# Patient Record
Sex: Male | Born: 1975 | Race: White | Hispanic: No | Marital: Single | Smoking: Current every day smoker
Health system: Southern US, Community
[De-identification: ages and names within clinical notes are randomized; demographics above are authoritative.]

## PROBLEM LIST (undated history)

## (undated) DIAGNOSIS — M419 Scoliosis, unspecified: Secondary | ICD-10-CM

## (undated) HISTORY — PX: MANDIBLE SURGERY: SHX707

## (undated) HISTORY — PX: TONSILLECTOMY: SUR1361

## (undated) HISTORY — PX: FRACTURE SURGERY: SHX138

## (undated) HISTORY — PX: CARPAL TUNNEL RELEASE: SHX101

## (undated) HISTORY — PX: ORTHOPEDIC SURGERY: SHX850

---

## 2001-04-07 ENCOUNTER — Ambulatory Visit (HOSPITAL_COMMUNITY): Admission: RE | Admit: 2001-04-07 | Discharge: 2001-04-07 | Payer: Self-pay | Admitting: *Deleted

## 2006-01-28 ENCOUNTER — Emergency Department (HOSPITAL_COMMUNITY): Admission: EM | Admit: 2006-01-28 | Discharge: 2006-01-28 | Payer: Self-pay | Admitting: Emergency Medicine

## 2007-01-18 ENCOUNTER — Ambulatory Visit (HOSPITAL_COMMUNITY): Admission: RE | Admit: 2007-01-18 | Discharge: 2007-01-18 | Payer: Self-pay | Admitting: Orthopaedic Surgery

## 2007-03-23 ENCOUNTER — Ambulatory Visit (HOSPITAL_COMMUNITY): Admission: RE | Admit: 2007-03-23 | Discharge: 2007-03-23 | Payer: Self-pay | Admitting: Orthopaedic Surgery

## 2010-06-01 ENCOUNTER — Emergency Department (HOSPITAL_COMMUNITY): Admission: EM | Admit: 2010-06-01 | Discharge: 2010-06-01 | Payer: Self-pay | Admitting: Emergency Medicine

## 2010-12-09 NOTE — H&P (Signed)
Adam Wyatt, Adam Wyatt              ACCOUNT NO.:  [id number]   MEDICAL RECORD NO.:  [id number]          PATIENT TYPE:  AMB   LOCATION:  DAY                           FACILITY:  [facility]   PHYSICIAN:  J. Darreld Mclean, M.D. DATE OF BIRTH:  [birth year]   DATE OF ADMISSION:  03/22/2007  DATE OF DISCHARGE:  LH                              HISTORY & PHYSICAL   CHIEF COMPLAINT:  My arm hurts.  Got carpal tunnel.   HISTORY OF PRESENT ILLNESS:  The patient is a 35 year old male with pain  and tenderness in his right hand for some time.  He has numbness,  dropping things.  He had EMG studies done by Dr. Meryl Crutch on March 15, 2007.  It shows abnormal study of the right median mononeuropathy  consistent with moderate right carpal tunnel syndrome without  denervation.  The patient did not improve with conservative treatment,  did not improve with splinting.  The patient does have underlying gout.  I have recommended surgical release of the volar carpal ligament.  He  understands the procedure risks and imponderables   PAST HISTORY:  1. Denies heart disease, lung disease, kidney disease per paralysis,      weakness, hypertension, diabetes, TB, rheumatic fever, cancer,      ulcer disease or heart problems.  2. He does have gout.   ALLERGIES:  He has no allergies.   HABITS:  Does not smoke.  Does not use alcoholic beverages.   MEDICATIONS:  The patient takes allopurinol 300 mg once or twice a day  and colchicine 0.6 mg t.i.d. as needed.  He does not have a family  doctor.   PAST SURGERIES:  1. He has a broken jaw 1999.  2. He had surgery by me on his knee years ago to the right patella on      October 13, 1991.   SOCIAL HISTORY:  The patient is not married, lives in [place].   PHYSICAL EXAMINATION:  GENERAL:  The patient is alert, cooperative,  oriented.  HEENT: Negative.  NECK: Supple.  LUNGS: Clear to P&A.  HEART: Regular without murmur heard.  ABDOMEN:  Soft without masses.  EXTREMITIES:  Positive Phalen's, positive Tinel's on the right.  Decreased sensation of the median nerve.  Negative on the left.  Other  extremities negative.  CNS: Intact.  SKIN:  Intact.   IMPRESSION:  Carpal tunnel syndrome on the right.   PLAN:  Open release of volar carpal ligament.  Labs are pending.                                            ______________________________  J. Darreld Mclean, M.D.     JWK/MEDQ  D:  03/22/2007  T:  03/23/2007  Job:  [id number]

## 2010-12-09 NOTE — Op Note (Signed)
NAMESHAQUAN, Adam Wyatt              ACCOUNT NO.:  [id number]   MEDICAL RECORD NO.:  [id number]          PATIENT TYPE:  AMB   LOCATION:  DAY                           FACILITY:  [facility]   PHYSICIAN:  J. Darreld Mclean, M.D. DATE OF BIRTH:  1976/07/26   DATE OF PROCEDURE:  DATE OF DISCHARGE:                               OPERATIVE REPORT   PREOPERATIVE DIAGNOSES:  Carpal tunnel syndrome, right.   POSTOPERATIVE DIAGNOSES:  Carpal tunnel syndrome, right.   PROCEDURE:  Open release volar carpal ligaments, saline neurolysis right  median nerve.   ANESTHESIA:  Nerve block.   SURGEON:  J. Darreld Mclean, M.D.   Volar plaster splint applied during the procedure. No drains.   INDICATIONS FOR PROCEDURE:  The patient is a 35 year old male with pain  and tenderness and right hand numbness, weakness. He has positive EMGs  showing moderate carpal tunnel on the right. The patient also has  underlying gout. Conservative treatment has been unsuccessful. The risks  and imponderables of the procedure were discussed preoperatively. He  appeared to understand and agreed to the procedure.   DESCRIPTION OF PROCEDURE:  The patient was seen in the holding area, the  right wrist was identified as the correct surgical site. I placed a mark  on the wrist and so did he. He was brought back to the operating room,  placed supine on the operating table with a tourniquet placed, deflated  right upper arm. He had a Bier block anesthesia. He was then prepped and  draped in the usual manner. We had a time out identifying [name] as  the patient and the right hand as the correct surgical site. An incision  was made on the volar aspect of the hand, the proximal retinaculum was  identified, the median nerve was identified, a Vessiloop placed around  the median nerve. A groove director placed in the carpal tunnel space.  The volar carpal ligament was then incised. With careful dissection the  median nerve was exposed.  It was compressed. The retinaculum was cut  proximally. A specimen of volar carpal ligament was sent to pathology.  Saline neurolysis and upper neurotomy carried out. The wound then  reinspected and looked good. The wound was then reapproximated using 3-0  nylon interrupted in a vertical mattress manner. A sterile dressing  applied, bulky dressing applied and sheet cotton applied. Sheet cotton  cut dorsally, volar plaster  splint applied. The patient tolerated the procedure well, the bier block  was deflated in the usual fashion. Went to recovery, appropriate  analgesia given for pain. I will see him in the office in approximately  10 days to 2 weeks. If any difficulty call me through office or hospital  beeper system. Numbers have been provided.           ______________________________  Shela Commons. Darreld Mclean, M.D.     JWK/MEDQ  D:  03/23/2007  T:  03/24/2007  Job:  [id number]

## 2010-12-12 NOTE — Op Note (Signed)
[place]  Patient:    Adam Wyatt, Adam Wyatt Visit Number: [id number] MRN: [id number]          Service Type: DSU Location: [facility] 28[address] 35 Attending Physician:  Effie Berkshire Dictated by:   Shela Commons. Kristen Cardinal, D.D.S. Proc. Date: 04/07/01 Admit Date:  04/07/2001                             Operative Report  PREOPERATIVE DIAGNOSIS:  Fractured right angle of the mandible.  POSTOPERATIVE DIAGNOSIS:  Fractured right angle of the mandible.  OPERATION PERFORMED:  Open and closed reduction of fractured right angle of the mandible with application of maxillary and mandibular arch bars and application of a titanium bone plate.  SURGEON:  Saddie Benders, D.D.S.  ANESTHESIA:  General.  COMPLICATIONS:  None.  DESCRIPTION OF PROCEDURE:  On April 07, 2001 this patient presented himself to the operating room of [facility] where after obtaining the proper level of anesthesia with the use of nasotracheal intubation, a nasogastric tube was placed.  A two-inch wet vaginal gauze packing was then placed in the oropharynx and an intraoral Betadine prep  was completed.  Sterile drapes were used to isolate the surgical field and the oral cavity thoroughly irrigated and suctioned.  At this time a maxillary and mandibular arch bar were placed and each was secured with #26 and 24 gauge stainless steel wires.  Oral cavity was thoroughly irrigated and suctioned and the throat pack was removed and at this time the decision was made to leave tooth #32 in position.  This was because the tooth did not demonstrate any increase in mobility and even though the fracture site was distal to the most distal root, the soft tissue attachment around the tooth was intact.  There was no exudate and there appeared to be no definite communication with the oral cavity.  Since this tooth was left, we then went ahead and placed the patient into  intermaxillary fixation utilizing four #26 gauge stainless steel wire loops.  As previously noted, this patient has an extreme malocclusion and the fact that he has a class 3 occlusion with an anterior open bite.  On the left side, preoperatively, it was determined that he had probably two molars and one bicuspid in contact and on the right side one and possibly two molars in contact.  His anterior teeth did not appear to be functional.  At this point the surgeons and assistants all degowned and degloved and the surgeon rescrubbed and everybody else placed new gowns and gloves on.  A Betadine prep was completed to the right side of the face and sterile drapes and towels were used to isolate the surgical field.  A total of 7.2 cc of 0.5% Marcaine with 1:200,000 epinephrine were utilized for local anesthesia and a marking pen was used to mark the inferior border of the mandible, the angle of the mandible and the fracture site.  A #15 blade was used to make a straight line incision approximately two fingers width below the inferior border of the right body and angle of the mandible.  Sharp dissection was carried through the skin and subcutaneous tissues and then the Metzenbaum scissors were used to undermine the tissue.  At this stage, blunt dissection was carried out and a nerve stimulator was used before any tissue was incised to observe for the marginal mandibular Carmer of the facial nerve.  It was encountered in the very anterior aspect of the incision and by simply going inferior to this area of tissue the nerve was avoided.  Blunt and sharp dissection was carried out to the platysma and then sharp dissection through the masseter muscle down to the periosteum overlying the fracture site which was easily identified.  The periosteal elevator was used to reflect the periosteum on the labial and lingual aspects of the mandible and the angle of the mandible and anterior towards the body.   Once the fracture was positively identified, Dingman forceps were used to reduce the fracture and hold it in position and at this point an alveoloplasty bur was used to smooth  a bony irregularity overlying the angle of the mandible.  A 4-0 holed titanium bone plate 2.5 mm was then bent into position over the fracture site and it was then secured after drilling 2.0 mm bur hole through both cortical plates and then using a 2.4 screw.  Satisfactory reduction was obtained and it was found to be very stable.  The entire incision was thoroughly irrigated and suctioned and then closure of the masseter muscle and periosteum was accomplished with #3-0 gut suture.  The platysma and subcutaneous tissue was reapproximated with #4-0 gut suture and then the skin incision was reapproximated with a #5-0 subcuticular nylon and a #5-0 running skin suture.  Bacitracin ointment was placed over the incision site, Telfa and then a Kerlix dressing.  The oral cavity was then thoroughly inspected, suctioned and the occlusion found to be that as previously obtained.  The patient was then transferred to the recovery room after removal of the nasogastric tube and the endotracheal tube in satisfactory condition.  Time for the procedure two and one half hours. Estimated blood loss for this procedure was 100 cc.   Condition of patient when last examined was deemed satisfactory. Dictated by:   Shela Commons. Kristen Cardinal, D.D.S. Attending Physician:  Effie Berkshire DD:  04/07/01 TD:  04/07/01 Job: [id number]

## 2011-01-05 ENCOUNTER — Other Ambulatory Visit (HOSPITAL_COMMUNITY): Payer: Self-pay | Admitting: Orthopaedic Surgery

## 2011-01-05 DIAGNOSIS — M25562 Pain in left knee: Secondary | ICD-10-CM

## 2011-01-07 ENCOUNTER — Ambulatory Visit (HOSPITAL_COMMUNITY)
Admission: RE | Admit: 2011-01-07 | Discharge: 2011-01-07 | Disposition: A | Discharge status: Home / Self Care | Payer: Medicaid Other | Source: Ambulatory Visit | Attending: Orthopaedic Surgery | Admitting: Orthopaedic Surgery

## 2011-01-07 DIAGNOSIS — M25569 Pain in unspecified knee: Secondary | ICD-10-CM | POA: Insufficient documentation

## 2011-01-07 DIAGNOSIS — M25562 Pain in left knee: Secondary | ICD-10-CM

## 2011-02-03 ENCOUNTER — Encounter: Payer: Self-pay | Admitting: *Deleted

## 2011-02-03 ENCOUNTER — Emergency Department (HOSPITAL_COMMUNITY)
Admission: EM | Admit: 2011-02-03 | Discharge: 2011-02-03 | Disposition: A | Discharge status: Home / Self Care | Payer: Medicaid Other | Attending: Emergency Medicine | Admitting: Emergency Medicine

## 2011-02-03 DIAGNOSIS — F17200 Nicotine dependence, unspecified, uncomplicated: Secondary | ICD-10-CM | POA: Insufficient documentation

## 2011-02-03 DIAGNOSIS — M4120 Other idiopathic scoliosis, site unspecified: Secondary | ICD-10-CM | POA: Insufficient documentation

## 2011-02-03 DIAGNOSIS — W540XXA Bitten by dog, initial encounter: Secondary | ICD-10-CM | POA: Insufficient documentation

## 2011-02-03 DIAGNOSIS — S81009A Unspecified open wound, unspecified knee, initial encounter: Secondary | ICD-10-CM | POA: Insufficient documentation

## 2011-02-03 DIAGNOSIS — Y92009 Unspecified place in unspecified non-institutional (private) residence as the place of occurrence of the external cause: Secondary | ICD-10-CM | POA: Insufficient documentation

## 2011-02-03 DIAGNOSIS — IMO0002 Reserved for concepts with insufficient information to code with codable children: Secondary | ICD-10-CM | POA: Insufficient documentation

## 2011-02-03 HISTORY — DX: Scoliosis, unspecified: M41.9

## 2011-02-03 MED ORDER — AMOXICILLIN-POT CLAVULANATE 875-125 MG PO TABS
1.0000 | ORAL_TABLET | Freq: Once | ORAL | Status: AC
  Administered 2011-02-03: 1 via ORAL
  Filled 2011-02-03: qty 1

## 2011-02-03 MED ORDER — AMOXICILLIN-POT CLAVULANATE 875-125 MG PO TABS: 1.0000 | ORAL_TABLET | Freq: Two times a day (BID) | ORAL | Status: AC

## 2011-02-03 MED ORDER — NAPROXEN 500 MG PO TABS: 500.0000 mg | ORAL_TABLET | Freq: Two times a day (BID) | ORAL | Status: AC

## 2011-02-03 MED ORDER — BACITRACIN ZINC 500 UNIT/GM EX OINT
TOPICAL_OINTMENT | CUTANEOUS | Status: AC
  Filled 2011-02-03: qty 0.9

## 2011-02-03 MED ORDER — BACITRACIN-NEOMYCIN-POLYMYXIN 400-5-5000 EX OINT
TOPICAL_OINTMENT | Freq: Once | CUTANEOUS | Status: AC
  Administered 2011-02-03: 22:00:00 via TOPICAL

## 2011-02-03 MED ORDER — TETANUS-DIPHTH-ACELL PERTUSSIS 5-2-15.5 LF-MCG/0.5 IM SUSP
0.5000 mL | Freq: Once | INTRAMUSCULAR | Status: AC
  Administered 2011-02-03: 0.5 mL via INTRAMUSCULAR
  Filled 2011-02-03: qty 0.5

## 2011-02-03 NOTE — ED Provider Notes (Signed)
History     Chief Complaint  Patient presents with  . Animal Bite   HPI Comments: patient c/o pain to right lower leg after being bitten by a dog.  States he was chasing his dog and his dog ran into another person's yard and the other dog bit him on the right anterior leg.  Patient does not know the status of the dog's vaccinations.  Incident occurred in [place].   Pt denies numbness, weakness, bleeding or other injuries   Patient is a 35 y.o. male presenting with animal bite. The history is provided by the patient.  Animal Bite  The incident occurred today. The incident occurred at another residence. He came to the ER via personal transport. There is an injury to the right lower leg. The pain is mild. It is unlikely that a foreign body is present. Associated symptoms include pain when bearing weight. Pertinent negatives include no numbness, no inability to bear weight, no neck pain, no focal weakness, no tingling and no weakness. There have been no prior injuries to these areas. His tetanus status is out of date. He has been behaving normally. There were no sick contacts. He has received no recent medical care.    Past Medical History  Diagnosis Date  . Scoliosis     Past Surgical History  Procedure Date  . Carpal tunnel release   . Orthopedic surgery     pt has two screws in right knee, place in left arm     History reviewed. No pertinent family history.  History  Substance Use Topics  . Smoking status: Current Some Day Smoker  . Smokeless tobacco: Not on file  . Alcohol Use: 1.2 oz/week    2 Cans of beer per week      Review of Systems  Constitutional: Negative.   HENT: Negative.  Negative for neck pain.   Respiratory: Negative.   Cardiovascular: Negative.   Skin: Positive for wound. Negative for color change.  Neurological: Negative for tingling, focal weakness, weakness and numbness.  Hematological: Does not bruise/bleed easily.    Physical Exam  BP  129/69  Pulse 83  Temp(Src) 98.2 F (36.8 C) (Oral)  Resp 20  Ht 5\' 11"  (1.803 m)  Wt 215 lb (97.523 kg)  BMI 29.99 kg/m2  SpO2 99%  Physical Exam  Constitutional: He is oriented to person, place, and time. He appears well-developed and well-nourished.  HENT:  Head: Normocephalic and atraumatic.  Eyes: EOM are normal. Pupils are equal, round, and reactive to light.  Neck: Normal range of motion. Neck supple.  Cardiovascular: Normal rate, regular rhythm and normal heart sounds.   Pulmonary/Chest: Effort normal and breath sounds normal.  Musculoskeletal: He exhibits no edema and no tenderness.  Neurological: He is alert and oriented to person, place, and time.  Skin:       Localized abrasion and several small puncture wounds to the anterior right lower leg.  No edema or bleeding.  Pt has full ROM of the extremity.    Psychiatric: He has a normal mood and affect.    ED Course  Procedures  MDM   Sheriff came to ED to file report.  Will try to capture the dog.  Sheriff's called ED to inform us that the dog has been contained and will be observed.  Patient has been advised to return for any signs of infection and will begin abx therapy, update Td and wound was cleaned htroughly by the nursing staff  Emmaus Brandi L. [place], [place] 02/07/11 1545

## 2011-02-03 NOTE — ED Notes (Signed)
Pt states that he was trying to get his dog who had run away from home away from another yard, pt went to get his dog  and was bitten by another dog,  Pt has abrasion to left ring finger and lacerations to right lower leg, bleeding controlled,

## 2011-02-03 NOTE — ED Notes (Signed)
[facility] here to take pt report

## 2011-02-03 NOTE — ED Notes (Signed)
[facility] called back to er and advised that the dog's tetanus status had expired, advised that the dog would be quarantined, tammy pa notified

## 2011-02-10 NOTE — ED Provider Notes (Signed)
Medical screening examination/treatment/procedure(s) were performed by non-physician practitioner and as supervising physician I was immediately available for consultation/collaboration.  Joya Gaskins, MD 02/10/11 [phone]

## 2011-05-08 LAB — DIFFERENTIAL
Basophils Absolute: 0
Basophils Relative: 0
Lymphocytes Relative: 26
Neutro Abs: 5.2
Neutrophils Relative %: 65

## 2011-05-08 LAB — COMPREHENSIVE METABOLIC PANEL
Alkaline Phosphatase: 56
BUN: 9
CO2: 30
Chloride: 102
Creatinine, Ser: 0.95
GFR calc Af Amer: >60
GFR calc non Af Amer: >60
Glucose, Bld: 91
Potassium: 4.3
Sodium: 138
Total Bilirubin: 0.8

## 2011-05-08 LAB — CBC
MCHC: 33.2
RDW: 14.8 — ABNORMAL HIGH

## 2011-05-08 LAB — URINALYSIS, ROUTINE W REFLEX MICROSCOPIC
Bilirubin Urine: NEGATIVE
Nitrite: NEGATIVE
Specific Gravity, Urine: 1.02
Urobilinogen, UA: 0.2
pH: 6.5

## 2012-02-04 ENCOUNTER — Other Ambulatory Visit (HOSPITAL_COMMUNITY): Payer: Self-pay | Admitting: Orthopaedic Surgery

## 2012-02-04 DIAGNOSIS — M542 Cervicalgia: Secondary | ICD-10-CM

## 2012-02-09 ENCOUNTER — Ambulatory Visit (HOSPITAL_COMMUNITY)
Admission: RE | Admit: 2012-02-09 | Discharge: 2012-02-09 | Disposition: A | Discharge status: Home / Self Care | Payer: Medicaid Other | Source: Ambulatory Visit | Attending: Orthopaedic Surgery | Admitting: Orthopaedic Surgery

## 2012-02-09 DIAGNOSIS — M5030 Other cervical disc degeneration, unspecified cervical region: Secondary | ICD-10-CM | POA: Insufficient documentation

## 2012-02-09 DIAGNOSIS — M542 Cervicalgia: Secondary | ICD-10-CM | POA: Insufficient documentation

## 2012-03-21 ENCOUNTER — Ambulatory Visit (HOSPITAL_COMMUNITY)
Admission: RE | Admit: 2012-03-21 | Discharge: 2012-03-21 | Disposition: A | Discharge status: Home / Self Care | Payer: Medicaid Other | Source: Ambulatory Visit | Attending: Orthopaedic Surgery | Admitting: Orthopaedic Surgery

## 2012-03-21 DIAGNOSIS — M542 Cervicalgia: Secondary | ICD-10-CM | POA: Insufficient documentation

## 2012-03-21 DIAGNOSIS — IMO0001 Reserved for inherently not codable concepts without codable children: Secondary | ICD-10-CM | POA: Insufficient documentation

## 2012-03-21 DIAGNOSIS — M6281 Muscle weakness (generalized): Secondary | ICD-10-CM | POA: Insufficient documentation

## 2012-03-21 NOTE — Evaluation (Signed)
Physical Therapy Evaluation  Patient Details  Name: Adam Wyatt MRN: [id number] Date of Birth: 05-16-1976  Today's Date: 03/21/2012 Time: 6213-0865 PT Time Calculation (min): 42 min  Visit#: 1  of 4   Re-eval: 04/01/12    Authorization: medicaid  Authorization Time Period: not authorized yet  Authorization Visit#: 0  of 3    Past Medical History:  Past Medical History  Diagnosis Date  . Scoliosis    Past Surgical History:  Past Surgical History  Procedure Date  . Carpal tunnel release   . Orthopedic surgery     pt has two screws in right knee, place in left arm     SubjectiveINITIAL EVALUATION  Physical Therapy   Patient Name: Adam Wyatt Date Of Birth: 09-Aug-1975 Guardian Name: N/A Treatment ICD-9 Code: 7231 Address: [address] Date of Evaluation: 03/21/2012 [place], [place] [address] Requested Dates of Service: 03/24/2012 - 03/31/2012 Therapy History: No known therapy for this problem Reason For Referral: Recipient has an ongoing injury, disease or condition Prior Level of Function: Independent/Modified Independent with all ADLs (OT/PT) or Audition, Communication, Voice and/or Swallowing Skills (ST/AUD) Additional Medical History: Mr. Platte states that he has scoliosis and had increasing neck pain. He states if he moves his head to far it feels like there is a knife stabbing in his neck. He states he has custody of his child and household such as washing dishes, clothes and mopping increases his pain. The patient had an MRI that shows osteoarthritis. He has been referred to PT reduce his sx of pain. Prematurity: N/A Severity Level: N/A Treatment Goals:  1. Goal: I HEP  Baseline: not completing any ex  Duration: 2 Week(s)  2. Goal: Pain decreased to where pt does not need pain medication.  Baseline: witn pain medication 5/10' without meds 9/10. Pain is more on the right.  Duration: 2 Week(s)  3. Goal: strength 4/5  Baseline: isometrically tested cervical mm is  3/5  Duration: 2 Week(s)  4. Goal: Pt to state he has not had a H/A for a week  Baseline: H/A daily  Duration: 2 Week(s)  5. Goal: pt to be able to verbalize importance of posture.  Baseline: Pt has poor posture.  Duration: 2 Week(s)  6. Goal: no mm spasms  Baseline: moderate spasm  Duration: 2 Week(s)  Treatment Frequency/Duration: 2x/week for 4 weeks  Units per visit: N/A   Exercise/Treatments Seated Exercises Cervical Isometrics: Extension;Right lateral flexion;Left lateral flexion;3 secs;5 reps Lateral Flexion: Both;5 reps X to V: 5 reps W Back: 5 reps Shoulder Shrugs: 5 reps;Limitations Shoulder Shrugs Limitations: up/back relax  Manual Therapy Manual Therapy: Massage Massage: cervical -T8 concentrating on R   Physical Therapy Assessment and Plan PT Assessment and Plan Clinical Impression Statement: Pt with hx of scoliosis.  Pt has decreased strength of cervical thoracic and lumbar mm who will benefit from skilled therapy to strengthen back mm to decrease pain and improve function. Pt will benefit from skilled therapeutic intervention in order to improve on the following deficits: Decreased activity tolerance;Pain;Decreased strength;Increased muscle spasms Rehab Potential: Good PT Frequency: Min 2X/week PT Duration:  (2 weeks) PT Treatment/Interventions: Manual techniques;Therapeutic exercise PT Plan: begin T-band, chest stretch, wall push up, prone chin tuck head lift next treatment; advance to standing postural t-band; then prone w back, SAR, hosizontal ab, ext, and double arm raise.     Problem List There is no problem list on file for this patient.   PT - End of Session  Activity Tolerance: Patient tolerated treatment well General Behavior During Session: [facility] for tasks performed Cognition: [facility] for tasks performed     RUSSELL,CINDY 03/21/2012, 4:29 PM  Physician Documentation Your signature is required to indicate approval of the treatment plan as stated  above.  Please sign and either send electronically or make a copy of this report for your files and return this physician signed original.   Please mark one 1.__approve of plan  2. ___approve of plan with the following conditions.   ______________________________                                                          _____________________ Physician Signature                                                                                                             Date

## 2012-03-30 ENCOUNTER — Ambulatory Visit (HOSPITAL_COMMUNITY)
Admission: RE | Admit: 2012-03-30 | Discharge: 2012-03-30 | Disposition: A | Discharge status: Home / Self Care | Payer: Medicaid Other | Source: Ambulatory Visit | Attending: Orthopaedic Surgery | Admitting: Orthopaedic Surgery

## 2012-03-30 DIAGNOSIS — IMO0001 Reserved for inherently not codable concepts without codable children: Secondary | ICD-10-CM | POA: Insufficient documentation

## 2012-03-30 DIAGNOSIS — M6281 Muscle weakness (generalized): Secondary | ICD-10-CM | POA: Insufficient documentation

## 2012-03-30 DIAGNOSIS — M542 Cervicalgia: Secondary | ICD-10-CM | POA: Insufficient documentation

## 2012-03-30 NOTE — Progress Notes (Signed)
Physical Therapy Treatment Patient Details  Name: Adam Wyatt MRN: [id number] Date of Birth: [birth year]  Today's Date: 03/30/2012 Time: 4098-1191 PT Time Calculation (min): 47 min  Visit#: 2  of 4   Re-eval: 04/01/12 (approved through 03/31/2012)  Charge: therex 30 Massage 15  Authorization: [employer]  Authorization Time Period: 03/24/2012-03/31/2012 approved for 3 visits  Authorization Visit#: 1  of 3    Subjective: Symptoms/Limitations Symptoms: Pt reported constant neck, increases with cervical rotation, 5/10 neck pain. Pain Assessment Currently in Pain?: Yes Pain Score:   5 Pain Location: Neck Pain Orientation: Posterior  Objective:   Exercise/Treatments Stretches Corner Stretch: 3 reps;30 seconds Standing Exercises Wall Push Ups: 10 reps;Limitations Wall Push Ups Limitations: with chin tucks Other Standing Exercises: scapular retraction, row and shoulder ext 10x each Seated Exercises Neck Retraction: 10 reps;5 secs Lateral Flexion: Both;10 reps Shoulder Rolls: 10 reps;Backwards;Limitations Shoulder Rolls Limitations: up/back relax Prone Exercises Other Prone Exercise: Chin tuck head lifts 10x 5"  Manual Therapy Manual Therapy: Massage Massage: cervical -T8 concentrating on R x 15 min  Physical Therapy Assessment and Plan PT Assessment and Plan Clinical Impression Statement: Began treatment per PT POC for cervical ROM, strengthening , and pain reduction.  Pt able to follow demonstration for new exercises with min cueing for proper form and technique.  Able to reduce spasms R cervical and mid traps with increased cervical ROM and pain decreased to 2/10 with manual at end of session. PT Plan: Continue with current POC to increase cervical ROM, scapular strengthening for posture and pain reduciton.  Next session begin prone w back, SAR, hosizontal ab, ext, and double arm raise    Goals    Problem List There is no problem list on file for this patient.   PT -  End of Session Activity Tolerance: Patient tolerated treatment well General Behavior During Session: [facility] for tasks performed Cognition: [facility] for tasks performed  GP    Juel Burrow 03/30/2012, 5:50 PM

## 2012-04-06 ENCOUNTER — Inpatient Hospital Stay (HOSPITAL_COMMUNITY): Admission: RE | Admit: 2012-04-06 | Payer: Medicaid Other | Source: Ambulatory Visit | Admitting: *Deleted

## 2012-04-13 ENCOUNTER — Ambulatory Visit (HOSPITAL_COMMUNITY)
Admission: RE | Admit: 2012-04-13 | Discharge: 2012-04-13 | Disposition: A | Discharge status: Home / Self Care | Payer: Medicaid Other | Source: Ambulatory Visit | Attending: Family Medicine | Admitting: Family Medicine

## 2012-04-13 NOTE — Progress Notes (Signed)
Physical Therapy Treatment Patient Details  Name: Adam Wyatt MRN: [id number] Date of Birth: 11-Feb-1976  Today's Date: 04/13/2012 Time: 4098-1191 PT Time Calculation (min): 41 min  Visit#: 3  of 4   Re-eval:      Authorization: medicaid  Authorization Time Period: 9/17- 9/26  Authorization Visit#: 2  of 3    Subjective: Symptoms/Limitations Symptoms: Pt states that he is sore; states that he is doing the ex about twice a week at home. Pain Assessment Currently in Pain?: Yes Pain Score:   4 Pain Location: Neck  Exercise/Treatments     Stretches Corner Stretch: 3 reps;30 seconds   Theraband Exercises Scapula Retraction: 10 reps Shoulder Extension: 10 reps Rows: 10 reps Standing Exercises Wall Push Ups: 10 reps;Limitations Wall Push Ups Limitations: with chin tucks Other Standing Exercises: scapular retraction, row and shoulder ext 10x each Seated Exercises Neck Retraction: 10 reps;5 secs Lateral Flexion: Both;10 reps Shoulder Rolls: 10 reps;Backwards;Limitations Shoulder Rolls Limitations: up/back relax   Prone Exercises Neck Retraction: 10 reps W Back: 10 reps Shoulder Extension: 10 reps Other Prone Exercise: Double arm raise x 10 Other Prone Exercise: horizontal abduction x10  Manual Therapy Manual Therapy: Massage Massage: cervical to T8 concentrating on R side.  Mod mm spasm C7T1 area  Physical Therapy Assessment and Plan PT Assessment and Plan Clinical Impression Statement: Pt c/o iof soreness after completing prone cervical retraction.  Reviewed ex and explained to pt that he was lifting head to high that the chin tuck head lift was a very small motion,  Pt given T-band for home use;mm spasm reduced with massage.  Pt demonstrated good form for new exercises. PT Plan: Review all ex for form as next treatment will by the last.    Goals    Problem List There is no problem list on file for this patient.   PT Plan of Care PT Home Exercise Plan:  given t-band pt will need prone ex.  GP    RUSSELL,CINDY 04/13/2012, 3:59 PM

## 2012-04-15 ENCOUNTER — Ambulatory Visit (HOSPITAL_COMMUNITY): Payer: Medicaid Other | Admitting: Physical Therapy

## 2012-04-19 ENCOUNTER — Ambulatory Visit (HOSPITAL_COMMUNITY): Payer: Medicaid Other | Admitting: *Deleted

## 2012-04-27 ENCOUNTER — Ambulatory Visit (HOSPITAL_COMMUNITY): Payer: Medicaid Other | Admitting: *Deleted

## 2012-05-10 ENCOUNTER — Ambulatory Visit (HOSPITAL_COMMUNITY)
Admission: RE | Admit: 2012-05-10 | Discharge: 2012-05-10 | Disposition: A | Discharge status: Home / Self Care | Payer: Medicaid Other | Source: Ambulatory Visit | Attending: Orthopaedic Surgery | Admitting: Orthopaedic Surgery

## 2012-05-10 DIAGNOSIS — IMO0001 Reserved for inherently not codable concepts without codable children: Secondary | ICD-10-CM | POA: Insufficient documentation

## 2012-05-10 DIAGNOSIS — M542 Cervicalgia: Secondary | ICD-10-CM | POA: Insufficient documentation

## 2012-05-10 DIAGNOSIS — M6281 Muscle weakness (generalized): Secondary | ICD-10-CM | POA: Insufficient documentation

## 2012-05-10 NOTE — Evaluation (Signed)
Physical Therapy Re-evaluation  Patient Details  Name: Adam Wyatt MRN: [id number] Date of Birth: Dec 22, 1975  Today's Date: 05/10/2012 Time: 1015-1106 PT Time Calculation (min): 51 min  Visit#: 4  of 4   Re-eval:   Assessment Diagnosis: neck pain Next MD Visit: 05/10/2012  Authorization:    Authorization Time Period:    Authorization Visit#: 3  of 3    Past Medical History:  Past Medical History  Diagnosis Date  . Scoliosis    Past Surgical History:  Past Surgical History  Procedure Date  . Carpal tunnel release   . Orthopedic surgery     pt has two screws in right knee, place in left arm     Subjective Symptoms/Limitations Symptoms: Adam Wyatt state he is doing his exercises more now.  Pt complained of catch at end of full rotation.  Pt helped his brother move over the weekend with a lot of lifting. Pt has increased pain. 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    Theraband Exercises Scapula Retraction: 10 reps Shoulder Extension: 10 reps Rows: 10 reps Standing Exercises Wall Push Ups: 10 reps;Limitations Wall Push Ups Limitations: with chin tucks Other Standing Exercises: scapular retraction, row and shoulder ext 10x each Seated Exercises X to V: 10 reps;Weight X to V Weights (lbs): 3 W Back: 10 reps;Weight W Back Weights (lbs): 3    Prone Exercises Neck Retraction:  10 reps W Back: 10 reps Shoulder Extension: 10 reps;Weights Shoulder Extension Weights (lbs): 3 Rows: 10 reps;Weights Rows Weights (lbs): 3 Other Prone Exercise: Double arm raise x 10       Physical Therapy Assessment and Plan PT Assessment and Plan Clinical Impression Statement: Pt demonstrates exercise techinque well; Pt ROM and strength has improved. Rehab Potential: Good PT Plan: Discharge from therapy    Goals Home Exercise Program Pt will Perform Home Exercise Program: Independently PT Short Term Goals PT Short Term Goal 1: no H/A PT Short Term Goal 1 - Progress: Not met PT Short Term Goal 2: strength at least 4/5 PT Short Term Goal 2 - Progress: Met PT Short Term Goal 3: Pt to verbalize the importance of proper posture in the cervical care PT Short Term Goal 3 - Progress: Met  Problem List There is no problem list on file for this patient.   PT - End of Session Activity Tolerance: Patient tolerated treatment well General Behavior During Session: [facility] for tasks performed Cognition: [facility] for tasks performed PT Plan of Care PT Home Exercise Plan: given  GP    RUSSELL,CINDY 05/10/2012, 11:06 AM  Physician Documentation Your signature is required to indicate approval of the treatment plan as stated above.  Please sign and either send electronically or make a copy of this report for your files and return this physician signed original.   Please mark one 1.__approve of plan  2. ___approve of plan with the following conditions.   ______________________________  _____________________ Physician Signature                                                                                                             Date

## 2012-09-15 ENCOUNTER — Other Ambulatory Visit (HOSPITAL_COMMUNITY): Payer: Self-pay | Admitting: Family Medicine

## 2012-09-15 ENCOUNTER — Ambulatory Visit (HOSPITAL_COMMUNITY)
Admission: RE | Admit: 2012-09-15 | Discharge: 2012-09-15 | Disposition: A | Discharge status: Home / Self Care | Payer: Medicaid Other | Source: Ambulatory Visit | Attending: Family Medicine | Admitting: Family Medicine

## 2012-09-15 DIAGNOSIS — M545 Low back pain, unspecified: Secondary | ICD-10-CM | POA: Insufficient documentation

## 2012-09-15 DIAGNOSIS — W19XXXA Unspecified fall, initial encounter: Secondary | ICD-10-CM

## 2012-09-15 DIAGNOSIS — M546 Pain in thoracic spine: Secondary | ICD-10-CM | POA: Insufficient documentation

## 2013-03-31 ENCOUNTER — Other Ambulatory Visit (HOSPITAL_COMMUNITY): Payer: Self-pay | Admitting: Orthopaedic Surgery

## 2013-03-31 DIAGNOSIS — M545 Low back pain: Secondary | ICD-10-CM

## 2013-04-04 ENCOUNTER — Ambulatory Visit (HOSPITAL_COMMUNITY)
Admission: RE | Admit: 2013-04-04 | Discharge: 2013-04-04 | Disposition: A | Discharge status: Home / Self Care | Payer: Medicaid Other | Source: Ambulatory Visit | Attending: Orthopaedic Surgery | Admitting: Orthopaedic Surgery

## 2013-04-04 ENCOUNTER — Encounter (HOSPITAL_COMMUNITY): Payer: Self-pay

## 2013-04-04 DIAGNOSIS — M47817 Spondylosis without myelopathy or radiculopathy, lumbosacral region: Secondary | ICD-10-CM | POA: Insufficient documentation

## 2013-04-04 DIAGNOSIS — M545 Low back pain, unspecified: Secondary | ICD-10-CM | POA: Insufficient documentation

## 2014-01-05 ENCOUNTER — Other Ambulatory Visit (HOSPITAL_COMMUNITY): Payer: Self-pay | Admitting: Family Medicine

## 2014-01-05 ENCOUNTER — Ambulatory Visit (HOSPITAL_COMMUNITY)
Admission: RE | Admit: 2014-01-05 | Discharge: 2014-01-05 | Disposition: A | Discharge status: Home / Self Care | Payer: Medicaid Other | Source: Ambulatory Visit | Attending: Family Medicine | Admitting: Family Medicine

## 2014-01-05 DIAGNOSIS — M25529 Pain in unspecified elbow: Secondary | ICD-10-CM

## 2014-01-05 DIAGNOSIS — M898X9 Other specified disorders of bone, unspecified site: Secondary | ICD-10-CM | POA: Insufficient documentation

## 2014-01-05 DIAGNOSIS — M7020 Olecranon bursitis, unspecified elbow: Secondary | ICD-10-CM | POA: Insufficient documentation

## 2014-03-16 ENCOUNTER — Ambulatory Visit (HOSPITAL_COMMUNITY)
Admission: RE | Admit: 2014-03-16 | Discharge: 2014-03-16 | Disposition: A | Discharge status: Home / Self Care | Payer: Medicaid Other | Source: Ambulatory Visit | Attending: Family Medicine | Admitting: Family Medicine

## 2014-03-16 ENCOUNTER — Other Ambulatory Visit (HOSPITAL_COMMUNITY): Payer: Self-pay | Admitting: Family Medicine

## 2014-03-16 DIAGNOSIS — R55 Syncope and collapse: Secondary | ICD-10-CM

## 2014-03-16 DIAGNOSIS — R51 Headache: Secondary | ICD-10-CM | POA: Diagnosis not present

## 2015-01-02 ENCOUNTER — Emergency Department (HOSPITAL_COMMUNITY): Payer: Medicaid Other

## 2015-01-02 ENCOUNTER — Emergency Department (HOSPITAL_COMMUNITY)
Admission: EM | Admit: 2015-01-02 | Discharge: 2015-01-02 | Disposition: A | Discharge status: Home / Self Care | Payer: Medicaid Other | Attending: Emergency Medicine | Admitting: Emergency Medicine

## 2015-01-02 ENCOUNTER — Encounter (HOSPITAL_COMMUNITY): Payer: Self-pay | Admitting: Emergency Medicine

## 2015-01-02 DIAGNOSIS — Y9389 Activity, other specified: Secondary | ICD-10-CM | POA: Insufficient documentation

## 2015-01-02 DIAGNOSIS — Z720 Tobacco use: Secondary | ICD-10-CM | POA: Insufficient documentation

## 2015-01-02 DIAGNOSIS — Y9289 Other specified places as the place of occurrence of the external cause: Secondary | ICD-10-CM | POA: Diagnosis not present

## 2015-01-02 DIAGNOSIS — Z79899 Other long term (current) drug therapy: Secondary | ICD-10-CM | POA: Insufficient documentation

## 2015-01-02 DIAGNOSIS — W28XXXA Contact with powered lawn mower, initial encounter: Secondary | ICD-10-CM | POA: Insufficient documentation

## 2015-01-02 DIAGNOSIS — S299XXA Unspecified injury of thorax, initial encounter: Secondary | ICD-10-CM | POA: Insufficient documentation

## 2015-01-02 DIAGNOSIS — S4992XA Unspecified injury of left shoulder and upper arm, initial encounter: Secondary | ICD-10-CM | POA: Diagnosis not present

## 2015-01-02 DIAGNOSIS — Y998 Other external cause status: Secondary | ICD-10-CM | POA: Insufficient documentation

## 2015-01-02 DIAGNOSIS — M419 Scoliosis, unspecified: Secondary | ICD-10-CM | POA: Insufficient documentation

## 2015-01-02 DIAGNOSIS — M25511 Pain in right shoulder: Secondary | ICD-10-CM

## 2015-01-02 MED ORDER — HYDROCODONE-ACETAMINOPHEN 5-325 MG PO TABS: 1.0000 | ORAL_TABLET | ORAL | Status: DC | PRN

## 2015-01-02 NOTE — ED Notes (Addendum)
Pt reports right shoulder pain since Saturday.pt reports was on a lawnmower that tilted on an incline and reports landed on right shoulder. nad noted.

## 2015-01-02 NOTE — ED Notes (Signed)
Pt made aware to return if symptoms worsen or if any life threatening symptoms occur.   

## 2015-01-02 NOTE — Discharge Instructions (Signed)
Shoulder Pain The shoulder is the joint that connects your arms to your body. The bones that form the shoulder joint include the upper arm bone (humerus), the shoulder blade (scapula), and the collarbone (clavicle). The top of the humerus is shaped like a ball and fits into a rather flat socket on the scapula (glenoid cavity). A combination of muscles and strong, fibrous tissues that connect muscles to bones (tendons) support your shoulder joint and hold the ball in the socket. Small, fluid-filled sacs (bursae) are located in different areas of the joint. They act as cushions between the bones and the overlying soft tissues and help reduce friction between the gliding tendons and the bone as you move your arm. Your shoulder joint allows a wide range of motion in your arm. This range of motion allows you to do things like scratch your back or throw a ball. However, this range of motion also makes your shoulder more prone to pain from overuse and injury. Causes of shoulder pain can originate from both injury and overuse and usually can be grouped in the following four categories:  Redness, swelling, and pain (inflammation) of the tendon (tendinitis) or the bursae (bursitis).  Instability, such as a dislocation of the joint.  Inflammation of the joint (arthritis).  Broken bone (fracture). HOME CARE INSTRUCTIONS   Apply ice to the sore area.  Put ice in a plastic bag.  Place a towel between your skin and the bag.  Leave the ice on for 15-20 minutes, 3-4 times per day for the first 2 days, or as directed by your health care provider.  Stop using cold packs if they do not help with the pain.  If you have a shoulder sling or immobilizer, wear it as long as your caregiver instructs. Only remove it to shower or bathe. Move your arm as little as possible, but keep your hand moving to prevent swelling.  Squeeze a soft ball or foam pad as much as possible to help prevent swelling.  Only take  over-the-counter or prescription medicines for pain, discomfort, or fever as directed by your caregiver. SEEK MEDICAL CARE IF:   Your shoulder pain increases, or new pain develops in your arm, hand, or fingers.  Your hand or fingers become cold and numb.  Your pain is not relieved with medicines. SEEK IMMEDIATE MEDICAL CARE IF:   Your arm, hand, or fingers are numb or tingling.  Your arm, hand, or fingers are significantly swollen or turn white or blue. MAKE SURE YOU:   Understand these instructions.  Will watch your condition.  Will get help right away if you are not doing well or get worse. Document Released: 04/22/2005 Document Revised: 11/27/2013 Document Reviewed: 06/27/2011 [facility] Patient Information 2015 [employer] [place]. This information is not intended to replace advice given to you by your health care provider. Make sure you discuss any questions you have with your health care provider.   Apply heat to your shoulder 20 minutes several times daily.  Continue taking her ibuprofen.  You may take hydrocodone as prescribed but do not drive within 4 hours of taking this medication as it will make you drowsy.  Call Dr. Romeo AppleHarrison for further evaluation if your pain persists beyond the next week.

## 2015-01-03 NOTE — ED Provider Notes (Signed)
CSN: [id number]     Arrival date & time 01/02/15  1428 History   First MD Initiated Contact with Patient 01/02/15 1505     Chief Complaint  Patient presents with  . Shoulder Pain     (Consider location/radiation/quality/duration/timing/severity/associated sxs/prior Treatment) Patient is a 39 y.o. male presenting with shoulder pain. The history is provided by the patient.  Shoulder Pain Location:  Shoulder Time since incident:  1 week Injury: yes   Mechanism of injury comment:  He tipped off a riding lawnmower while mowing on an incline, the right shoulder hitting pavement. Shoulder location:  R shoulder Pain details:    Quality:  Aching and throbbing   Radiates to:  Does not radiate   Severity:  Moderate   Onset quality:  Sudden   Duration:  1 week   Timing:  Constant   Progression:  Unchanged Chronicity:  New Handedness:  Right-handed Dislocation: no   Foreign body present: no skin injury. Prior injury to area:  No Relieved by:  Nothing Worsened by:  Movement Ineffective treatments:  Acetaminophen and NSAIDs Associated symptoms: decreased range of motion   Associated symptoms: no back pain, no fever, no neck pain, no numbness, no swelling and no tingling     Past Medical History  Diagnosis Date  . Scoliosis    Past Surgical History  Procedure Laterality Date  . Carpal tunnel release    . Orthopedic surgery      pt has two screws in right knee, place in left arm    History reviewed. No pertinent family history. History  Substance Use Topics  . Smoking status: Current Every Day Smoker -- 0.50 packs/day  . Smokeless tobacco: Not on file  . Alcohol Use: No    Review of Systems  Constitutional: Negative for fever.  Musculoskeletal: Positive for arthralgias. Negative for myalgias, back pain, joint swelling and neck pain.  Neurological: Negative for weakness and numbness.      Allergies  Codeine  Home Medications   Prior to Admission medications    Medication Sig Start Date End Date Taking? Authorizing Provider  ALPRAZolam Prudy Feeler) 0.5 MG tablet Take 0.5 mg by mouth at bedtime.   Yes Historical Provider, MD  aspirin-acetaminophen-caffeine (EXCEDRIN MIGRAINE) [phone] MG per tablet Take 3 tablets by mouth daily as needed (pain).   Yes Historical Provider, MD  Multiple Vitamin (MULTIVITAMIN WITH MINERALS) TABS tablet Take 1 tablet by mouth daily.   Yes Historical Provider, MD  HYDROcodone-acetaminophen (NORCO/VICODIN) 5-325 MG per tablet Take 1 tablet by mouth every 4 (four) hours as needed. 01/02/15   Burgess Amor, PA-C   BP 131/77 mmHg  Pulse 80  Temp(Src) 98.1 F (36.7 C) (Oral)  Resp 18  Ht 5\' 11"  (1.803 m)  Wt 195 lb (88.451 kg)  BMI 27.21 kg/m2  SpO2 98% Physical Exam  Constitutional: He appears well-developed and well-nourished.  HENT:  Head: Atraumatic.  Neck: Normal range of motion.  Cardiovascular:  Pulses equal bilaterally  Musculoskeletal: He exhibits tenderness.       Right shoulder: He exhibits tenderness and bony tenderness. He exhibits no swelling, no crepitus, no deformity, no spasm, normal pulse and normal strength.  ttp anterior humerus. Also ttp along right upper chest wall along the superior pectoralis muscle. No palpable deformity. No edema, no bruising.  Pain with right shoulder anterior flexion and abduction beyond 90 degrees.  Neurological: He is alert. He has normal strength. He displays normal reflexes. No sensory deficit.  Skin: Skin is warm and  dry.  Psychiatric: He has a normal mood and affect.    ED Course  Procedures (including critical care time) Labs Review Labs Reviewed - No data to display  Imaging Review Dg Shoulder Right  01/02/2015   CLINICAL DATA:  Tripped over long for a only neural right shoulder. Injury occur 4 days prior.  EXAM: RIGHT SHOULDER - 2+ VIEW  COMPARISON:  None.  FINDINGS: Glenohumeral joint is intact. No evidence of scapular fracture or humeral fracture. The  acromioclavicular joint is intact.  IMPRESSION: No acute cardiopulmonary process.   Electronically Signed   By: Genevive Bi M.D.   On: 01/02/2015 15:39     EKG Interpretation None      MDM   Final diagnoses:  Shoulder pain, acute, right    Patients labs and/or radiological studies were reviewed and considered during the medical decision making and disposition process.  Results were also discussed with patient. Suspect muscle strain/shoulder sprain, cannot rule out possible rotator injury which was discussed with patient. He was placed in an arm sling for prn rest of the shoulder joint, discussed maintaining mobility. Hydrocodone. Heat tx. Referral to ortho for f/u care if pain persists or do not improve over the next week.  He was encouraged to continue taking his ibuprofen.    Burgess Amor, PA-C 01/03/15 1610  Gilda Crease, MD 01/03/15 [phone]

## 2015-10-30 ENCOUNTER — Encounter (HOSPITAL_COMMUNITY): Payer: Self-pay | Admitting: Emergency Medicine

## 2015-10-30 ENCOUNTER — Emergency Department (HOSPITAL_COMMUNITY)
Admission: EM | Admit: 2015-10-30 | Discharge: 2015-10-30 | Disposition: A | Discharge status: Home / Self Care | Payer: Medicaid Other | Attending: Emergency Medicine | Admitting: Emergency Medicine

## 2015-10-30 DIAGNOSIS — F17200 Nicotine dependence, unspecified, uncomplicated: Secondary | ICD-10-CM | POA: Diagnosis not present

## 2015-10-30 DIAGNOSIS — M25511 Pain in right shoulder: Secondary | ICD-10-CM | POA: Insufficient documentation

## 2015-10-30 MED ORDER — MELOXICAM 7.5 MG PO TABS: 7.5000 mg | ORAL_TABLET | Freq: Every day | ORAL | Status: DC

## 2015-10-30 MED ORDER — HYDROCODONE-ACETAMINOPHEN 5-325 MG PO TABS: 2.0000 | ORAL_TABLET | ORAL | Status: DC | PRN

## 2015-10-30 NOTE — ED Provider Notes (Signed)
CSN: [id number]     Arrival date & time 10/30/15  1154 History   First MD Initiated Contact with Patient 10/30/15 1436     Chief Complaint  Patient presents with  . Shoulder Pain     (Consider location/radiation/quality/duration/timing/severity/associated sxs/prior Treatment) Patient is a 40 y.o. male presenting with shoulder pain. The history is provided by the patient. No language interpreter was used.  Shoulder Pain Location:  Shoulder Time since incident:  10 months Injury: no   Shoulder location:  R shoulder Pain details:    Quality:  Aching   Radiates to:  Does not radiate   Severity:  Moderate   Onset quality:  Gradual   Duration:  10 months   Timing:  Constant   Progression:  Worsening Chronicity:  New Dislocation: no   Relieved by:  Nothing Worsened by:  Nothing tried Ineffective treatments:  None tried Associated symptoms: back pain   Risk factors: no recent illness     Past Medical History  Diagnosis Date  . Scoliosis    Past Surgical History  Procedure Laterality Date  . Carpal tunnel release    . Orthopedic surgery      pt has two screws in right knee, place in left arm    No family history on file. Social History  Substance Use Topics  . Smoking status: Current Every Day Smoker -- 0.50 packs/day  . Smokeless tobacco: None  . Alcohol Use: No    Review of Systems  Musculoskeletal: Positive for back pain.  All other systems reviewed and are negative.     Allergies  Codeine  Home Medications   Prior to Admission medications   Medication Sig Start Date End Date Taking? Authorizing Provider  ALPRAZolam Prudy Feeler) 0.5 MG tablet Take 0.5 mg by mouth at bedtime.    Historical Provider, MD  aspirin-acetaminophen-caffeine (EXCEDRIN MIGRAINE) [phone] MG per tablet Take 3 tablets by mouth daily as needed (pain).    Historical Provider, MD  HYDROcodone-acetaminophen (NORCO/VICODIN) 5-325 MG tablet Take 2 tablets by mouth every 4 (four) hours as  needed. 10/30/15   Elson Areas, PA-C  meloxicam (MOBIC) 7.5 MG tablet Take 1 tablet (7.5 mg total) by mouth daily. 10/30/15   Elson Areas, PA-C  Multiple Vitamin (MULTIVITAMIN WITH MINERALS) TABS tablet Take 1 tablet by mouth daily.    Historical Provider, MD   BP 132/84 mmHg  Pulse 97  Temp(Src) 98.5 F (36.9 C) (Oral)  Resp 16  Ht  (1.803 m)  Wt 95.709 kg  BMI 29.44 kg/m2  SpO2 98% Physical Exam  Constitutional: He is oriented to person, place, and time. He appears well-developed and well-nourished.  HENT:  Head: Normocephalic and atraumatic.  Right Ear: External ear normal.  Left Ear: External ear normal.  Eyes: Conjunctivae and EOM are normal. Pupils are equal, round, and reactive to light.  Neck: Normal range of motion.  Cardiovascular: Normal rate, regular rhythm and normal heart sounds.   Pulmonary/Chest: Effort normal.  Abdominal: Soft. He exhibits no distension.  Musculoskeletal: He exhibits tenderness.  Limited range of motion  nv and ns intact  Neurological: He is alert and oriented to person, place, and time.  Skin: Skin is warm.  Psychiatric: He has a normal mood and affect.  Nursing note and vitals reviewed.   ED Course  Procedures (including critical care time) Labs Review Labs Reviewed - No data to display  Imaging Review No results found. I have personally reviewed and evaluated these images and  lab results as part of my medical decision-making.   EKG Interpretation None      MDM Pt has had pain in shoulder since June 2016.  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  Pt has seen Dr. Hilda LiasKeeling in the past.  I advised him  I think he should see Dr. Hilda LiasKeeling for evaluation.     Final diagnoses:  Shoulder pain, right    Meds ordered this encounter  Medications  . HYDROcodone-acetaminophen (NORCO/VICODIN) 5-325 MG tablet    Sig: Take 2 tablets by mouth every 4 (four) hours as needed.    Dispense:  20 tablet    Refill:  0    Order Specific Question:   Supervising Provider    Answer:  MILLER, BRIAN [[id number]]  . meloxicam (MOBIC) 7.5 MG tablet    Sig: Take 1 tablet (7.5 mg total) by mouth daily.    Dispense:  20 tablet    Refill:  0    Order Specific Question:  Supervising Provider    Answer:  Eber HongMILLER, BRIAN [[id number]]    An After Visit Summary was printed and given to the patient.  Lonia SkinnerLeslie K AlexandriaSofia, PA-C 10/30/15 1517  Vanetta MuldersScott Zackowski, MD 10/31/15 [phone]

## 2015-10-30 NOTE — ED Notes (Signed)
Right shoulder and arm pain, continues to get worse, denies injury

## 2015-10-30 NOTE — Discharge Instructions (Signed)

## 2015-10-30 NOTE — ED Notes (Signed)
Called to place patient in room, no answer. 

## 2015-11-01 ENCOUNTER — Ambulatory Visit: Payer: Medicaid Other | Admitting: Orthopedic Surgery

## 2016-01-26 ENCOUNTER — Encounter (HOSPITAL_COMMUNITY): Payer: Self-pay

## 2016-01-26 ENCOUNTER — Emergency Department (HOSPITAL_COMMUNITY)
Admission: EM | Admit: 2016-01-26 | Discharge: 2016-01-26 | Disposition: A | Discharge status: Home / Self Care | Payer: Medicaid Other | Attending: Emergency Medicine | Admitting: Emergency Medicine

## 2016-01-26 DIAGNOSIS — Z202 Contact with and (suspected) exposure to infections with a predominantly sexual mode of transmission: Secondary | ICD-10-CM | POA: Insufficient documentation

## 2016-01-26 DIAGNOSIS — F17210 Nicotine dependence, cigarettes, uncomplicated: Secondary | ICD-10-CM | POA: Diagnosis not present

## 2016-01-26 DIAGNOSIS — A64 Unspecified sexually transmitted disease: Secondary | ICD-10-CM | POA: Diagnosis present

## 2016-01-26 NOTE — ED Notes (Signed)
Patient states he wants to be checked for STD. Denies drainage, GI symptoms, but states itching in groin.

## 2016-01-26 NOTE — Discharge Instructions (Signed)
Safe Sex  Safe sex is about reducing the risk of giving or getting a sexually transmitted disease (STD). STDs are spread through sexual contact involving the genitals, mouth, or rectum. Some STDs can be cured and others cannot. Safe sex can also prevent unintended pregnancies.   WHAT ARE SOME SAFE SEX PRACTICES?  · Limit your sexual activity to only one partner who is having sex with only you.  · Talk to your partner about his or her past partners, past STDs, and drug use.  · Use a condom every time you have sexual intercourse. This includes vaginal, oral, and anal sexual activity. Both females and males should wear condoms during oral sex. Only use latex or polyurethane condoms and water-based lubricants. Using petroleum-based lubricants or oils to lubricate a condom will weaken the condom and increase the chance that it will break. The condom should be in place from the beginning to the end of sexual activity. Wearing a condom reduces, but does not completely eliminate, your risk of getting or giving an STD. STDs can be spread by contact with infected body fluids and skin.  · Get vaccinated for hepatitis B and HPV.  · Avoid alcohol and recreational drugs, which can affect your judgment. You may forget to use a condom or participate in high-risk sex.  · For females, avoid douching after sexual intercourse. Douching can spread an infection farther into the reproductive tract.  · Check your body for signs of sores, blisters, rashes, or unusual discharge. See your health care provider if you notice any of these signs.  · Avoid sexual contact if you have symptoms of an infection or are being treated for an STD. If you or your partner has herpes, avoid sexual contact when blisters are present. Use condoms at all other times.  · If you are at risk of being infected with HIV, it is recommended that you take a prescription medicine daily to prevent HIV infection. This is called pre-exposure prophylaxis (PrEP). You are  considered at risk if:    You are a man who has sex with other men (MSM).    You are a heterosexual man or woman who is sexually active with more than one partner.    You take drugs by injection.    You are sexually active with a partner who has HIV.  · Talk with your health care provider about whether you are at high risk of being infected with HIV. If you choose to begin PrEP, you should first be tested for HIV. You should then be tested every 3 months for as long as you are taking PrEP.  · See your health care provider for regular screenings, exams, and tests for other STDs. Before having sex with a new partner, each of you should be screened for STDs and should talk about the results with each other.  WHAT ARE THE BENEFITS OF SAFE SEX?   · There is less chance of getting or giving an STD.  · You can prevent unwanted or unintended pregnancies.  · By discussing safe sex concerns with your partner, you may increase feelings of intimacy, comfort, trust, and honesty between the two of you.     This information is not intended to replace advice given to you by your health care provider. Make sure you discuss any questions you have with your health care provider.     Document Released: 08/20/2004 Document Revised: 08/03/2014 Document Reviewed: 01/04/2012  [employer]vier Interactive Patient Education ©2016 [employer]

## 2016-01-26 NOTE — ED Provider Notes (Addendum)
History  By signing my name below, I, Adam Wyatt, attest that this documentation has been prepared under the direction and in the presence of Adam Wyatt, [place]. Electronically Signed: Earmon PhoenixJennifer Wyatt, ED Scribe. 01/26/2016. 8:07 PM.  Chief Complaint  Patient presents with  . SEXUALLY TRANSMITTED DISEASE   The history is provided by the patient and medical records. No language interpreter was used.    HPI Comments:  Adam Wyatt is a 40 y.o. male who presents to the Emergency Department wanting to be checked for STDs. Pt states he had unprotected sex with one male about one month ago and wants to be checked. He states he heard the woman had herpes but he is unsure. He denies any symptoms such as genital sores, fever, chills, nausea, vomiting, penile discharge, penile or scrotal pain or swelling. He also states he wants to have his tetanus vaccination updated. PCP is Dr. Pearson GrippeJames Wyatt.  Past Medical History  Diagnosis Date  . Scoliosis    Past Surgical History  Procedure Laterality Date  . Carpal tunnel release    . Orthopedic surgery      pt has two screws in right knee, place in left arm    History reviewed. No pertinent family history. Social History  Substance Use Topics  . Smoking status: Current Every Day Smoker -- 1.00 packs/day    Types: Cigarettes  . Smokeless tobacco: None  . Alcohol Use: 1.2 oz/week    2 Cans of beer per week     Comment: occasional    Review of Systems  Constitutional: Negative for fever and chills.  Gastrointestinal: Negative for nausea, vomiting and abdominal pain.  Genitourinary: Negative for discharge, penile swelling, scrotal swelling, genital sores, penile pain and testicular pain.  All other systems reviewed and are negative.   Allergies  Codeine  Home Medications   Prior to Admission medications   Medication Sig Start Date End Date Taking? Authorizing Provider  ALPRAZolam Adam Wyatt(XANAX) 0.5 MG tablet Take 0.5 mg by mouth at  bedtime.    Historical Provider, MD  aspirin-acetaminophen-caffeine (EXCEDRIN MIGRAINE) [phone] MG per tablet Take 3 tablets by mouth daily as needed (pain).    Historical Provider, MD  HYDROcodone-acetaminophen (NORCO/VICODIN) 5-325 MG tablet Take 2 tablets by mouth every 4 (four) hours as needed. 10/30/15   Adam AreasLeslie K Sofia, PA-C  meloxicam (MOBIC) 7.5 MG tablet Take 1 tablet (7.5 mg total) by mouth daily. 10/30/15   Adam AreasLeslie K Sofia, PA-C  Multiple Vitamin (MULTIVITAMIN WITH MINERALS) TABS tablet Take 1 tablet by mouth daily.    Historical Provider, MD   Triage Vitals: BP 143/82 mmHg  Pulse 116  Temp(Src) 98.6 F (37 C) (Oral)  Resp 18  Ht 5\' 11"  (1.803 m)  Wt 208 lb (94.348 kg)  BMI 29.02 kg/m2  SpO2 97% Physical Exam  Constitutional: He is oriented to person, place, and time. He appears well-developed and well-nourished.  HENT:  Head: Normocephalic.  Eyes: EOM are normal.  Neck: Normal range of motion.  Pulmonary/Chest: Effort normal.  Abdominal: He exhibits no distension.  Musculoskeletal: Normal range of motion.  Neurological: He is alert and oriented to person, place, and time.  Psychiatric: He has a normal mood and affect.  Nursing note and vitals reviewed.   ED Course  Procedures (including critical care time) DIAGNOSTIC STUDIES: Oxygen Saturation is 97% on RA, normal by my interpretation.   COORDINATION OF CARE: 8:03 PM- Will order STD panel and update tetanus vaccination. Encouraged pt to follow up with  PCP for complete STD panel testing. Pt verbalizes understanding and agrees to plan.  Medications - No data to display  Labs Review Labs Reviewed - No data to display  Imaging Review No results found. I have personally reviewed and evaluated these images and lab results as part of my medical decision-making.   EKG Interpretation None      MDM Pt has no current symptoms.  Pt is worried he could have been exposed to herpes or other STD's.  Pt reports he came to  ED because he was told he could get immediate test results.  Pt advised results will return in 24-48 hours.  Pt reports problems with anxiety and wants Rx for ativan.  Pt is followed by Dr. Selena BattenKim.  Gc/ct/rpr and hiv ordered.  I advised pt to call Dr. Selena BattenKim to be seen for further concerns.    Final diagnoses:  Possible exposure to STD    An After Visit Summary was printed and given to the patient.    Lonia SkinnerLeslie K Hyde ParkSofia, PA-C 01/26/16 2339  Adam BerkshireJoseph Zammit, MD 01/27/16 [id number]1520  Adam K Trinity VillageSofia, [place]PA-C 03/04/16 1348  Adam BerkshireJoseph Zammit, MD 03/09/16 [phone]

## 2016-01-28 LAB — RPR: RPR Ser Ql: NONREACTIVE

## 2016-01-28 LAB — HIV ANTIBODY (ROUTINE TESTING W REFLEX): HIV Screen 4th Generation wRfx: NONREACTIVE

## 2016-01-29 LAB — GC/CHLAMYDIA PROBE AMP (~~LOC~~) NOT AT ARMC
CHLAMYDIA, DNA PROBE: NEGATIVE
Neisseria Gonorrhea: NEGATIVE

## 2016-02-24 ENCOUNTER — Ambulatory Visit (HOSPITAL_COMMUNITY)
Admission: RE | Admit: 2016-02-24 | Discharge: 2016-02-24 | Disposition: A | Discharge status: Home / Self Care | Payer: Medicaid Other | Source: Ambulatory Visit | Attending: Internal Medicine | Admitting: Internal Medicine

## 2016-02-24 ENCOUNTER — Encounter (HOSPITAL_COMMUNITY): Payer: Self-pay

## 2016-02-24 ENCOUNTER — Other Ambulatory Visit (HOSPITAL_COMMUNITY): Payer: Self-pay | Admitting: Internal Medicine

## 2016-02-24 DIAGNOSIS — R52 Pain, unspecified: Secondary | ICD-10-CM

## 2016-03-03 ENCOUNTER — Telehealth: Payer: Self-pay | Admitting: *Deleted

## 2016-03-03 NOTE — Telephone Encounter (Signed)
Request come in from pharmacy requesting refill on Naproxen 500 mg QTY #60

## 2016-03-04 MED ORDER — HYDROCODONE-ACETAMINOPHEN 5-325 MG PO TABS: 2.0000 | ORAL_TABLET | ORAL | 0 refills | Status: DC | PRN

## 2016-03-17 ENCOUNTER — Ambulatory Visit (HOSPITAL_COMMUNITY)
Admission: RE | Admit: 2016-03-17 | Discharge: 2016-03-17 | Disposition: A | Discharge status: Home / Self Care | Payer: Medicaid Other | Source: Ambulatory Visit | Attending: Internal Medicine | Admitting: Internal Medicine

## 2016-03-17 ENCOUNTER — Other Ambulatory Visit (HOSPITAL_COMMUNITY): Payer: Self-pay | Admitting: Internal Medicine

## 2016-03-17 DIAGNOSIS — M549 Dorsalgia, unspecified: Secondary | ICD-10-CM

## 2016-03-17 DIAGNOSIS — M4184 Other forms of scoliosis, thoracic region: Secondary | ICD-10-CM | POA: Insufficient documentation

## 2016-06-15 ENCOUNTER — Other Ambulatory Visit (HOSPITAL_COMMUNITY): Payer: Self-pay | Admitting: Internal Medicine

## 2016-06-15 ENCOUNTER — Ambulatory Visit (HOSPITAL_COMMUNITY)
Admission: RE | Admit: 2016-06-15 | Discharge: 2016-06-15 | Disposition: A | Discharge status: Home / Self Care | Payer: Medicaid Other | Source: Ambulatory Visit | Attending: Internal Medicine | Admitting: Internal Medicine

## 2016-06-15 DIAGNOSIS — R059 Cough, unspecified: Secondary | ICD-10-CM

## 2016-06-15 DIAGNOSIS — J9811 Atelectasis: Secondary | ICD-10-CM | POA: Insufficient documentation

## 2016-06-15 DIAGNOSIS — R05 Cough: Secondary | ICD-10-CM | POA: Insufficient documentation

## 2017-02-13 ENCOUNTER — Encounter (HOSPITAL_COMMUNITY): Payer: Self-pay | Admitting: *Deleted

## 2017-02-13 ENCOUNTER — Emergency Department (HOSPITAL_COMMUNITY)
Admission: EM | Admit: 2017-02-13 | Discharge: 2017-02-13 | Disposition: A | Discharge status: Home / Self Care | Payer: Medicaid Other | Attending: Emergency Medicine | Admitting: Emergency Medicine

## 2017-02-13 ENCOUNTER — Emergency Department (HOSPITAL_COMMUNITY): Payer: Medicaid Other

## 2017-02-13 DIAGNOSIS — F17210 Nicotine dependence, cigarettes, uncomplicated: Secondary | ICD-10-CM | POA: Insufficient documentation

## 2017-02-13 DIAGNOSIS — S60211A Contusion of right wrist, initial encounter: Secondary | ICD-10-CM | POA: Insufficient documentation

## 2017-02-13 DIAGNOSIS — Y929 Unspecified place or not applicable: Secondary | ICD-10-CM | POA: Diagnosis not present

## 2017-02-13 DIAGNOSIS — Y939 Activity, unspecified: Secondary | ICD-10-CM | POA: Diagnosis not present

## 2017-02-13 DIAGNOSIS — Y999 Unspecified external cause status: Secondary | ICD-10-CM | POA: Diagnosis not present

## 2017-02-13 DIAGNOSIS — X58XXXA Exposure to other specified factors, initial encounter: Secondary | ICD-10-CM | POA: Insufficient documentation

## 2017-02-13 DIAGNOSIS — S6991XA Unspecified injury of right wrist, hand and finger(s), initial encounter: Secondary | ICD-10-CM | POA: Diagnosis present

## 2017-02-13 MED ORDER — OXYCODONE-ACETAMINOPHEN 5-325 MG PO TABS
2.0000 | ORAL_TABLET | Freq: Once | ORAL | Status: AC
  Administered 2017-02-13: 2 via ORAL
  Filled 2017-02-13: qty 2

## 2017-02-13 NOTE — ED Provider Notes (Signed)
[facility]-EMERGENCY DEPT Provider Note   CSN: [id number] Arrival date & time: 02/13/17  1614     History   Chief Complaint Chief Complaint  Patient presents with  . Wrist Pain    HPI Adam Wyatt is a 41 y.o. male.  The history is provided by the patient. No language interpreter was used.  Wrist Pain  This is a new problem. The current episode started yesterday. The problem occurs constantly. The problem has not changed since onset.Nothing aggravates the symptoms. Nothing relieves the symptoms. He has tried nothing for the symptoms. The treatment provided no relief.  Pt fell yesterday.  Pt complains of pain in his wrist.  Pt has taken ibuprofen without relief.  Pt is in pain management but is out of percocet.   Past Medical History:  Diagnosis Date  . Scoliosis     There are no active problems to display for this patient.   Past Surgical History:  Procedure Laterality Date  . CARPAL TUNNEL RELEASE    . MANDIBLE SURGERY    . ORTHOPEDIC SURGERY     pt has two screws in right knee, place in left arm        Home Medications    Prior to Admission medications   Medication Sig Start Date End Date Taking? Authorizing Provider  ALPRAZolam Prudy Feeler(XANAX) 0.5 MG tablet Take 0.5 mg by mouth at bedtime.    [provider]  aspirin-acetaminophen-caffeine (EXCEDRIN MIGRAINE) [phone] MG per tablet Take 3 tablets by mouth daily as needed (pain).    [provider]  HYDROcodone-acetaminophen (NORCO/VICODIN) 5-325 MG tablet Take 2 tablets by mouth every 4 (four) hours as needed. 03/04/16   Darreld McleanKeeling, Wayne, MD  meloxicam (MOBIC) 7.5 MG tablet Take 1 tablet (7.5 mg total) by mouth daily. 10/30/15   Elson AreasSofia, Jadia Capers K, PA-C  Multiple Vitamin (MULTIVITAMIN WITH MINERALS) TABS tablet Take 1 tablet by mouth daily.    [provider]    Family History No family history on file.  Social History Social History  Substance Use Topics  . Smoking status: Current Every Day  Smoker    Packs/day: 0.50    Types: Cigarettes  . Smokeless tobacco: Never Used  . Alcohol use 1.2 oz/week    2 Cans of beer per week     Comment: occasional     Allergies   Codeine   Review of Systems Review of Systems  All other systems reviewed and are negative.    Physical Exam Updated Vital Signs BP (!) 144/91 (BP Location: Left Arm)   Pulse 97   Temp 97.9 F (36.6 C) (Oral)   Resp 18   Ht 5\' 11"  (1.803 m)   Wt 96.2 kg (212 lb)   SpO2 97%   BMI 29.57 kg/m   Physical Exam  Constitutional: He appears well-developed and well-nourished.  Musculoskeletal: He exhibits tenderness.  Tender distal wrist,  Pain with range of motion  Neurological: He is alert.  Skin: Skin is warm.  Psychiatric: He has a normal mood and affect.  Nursing note and vitals reviewed.    ED Treatments / Results  Labs (all labs ordered are listed, but only abnormal results are displayed) Labs Reviewed - No data to display  EKG  EKG Interpretation None       Radiology Dg Wrist Complete Right  Result Date: 02/13/2017 CLINICAL DATA:  Larey SeatFell on outstretched RIGHT hand, pain at proximal second metacarpal region with red knot, injury EXAM: RIGHT WRIST - COMPLETE 3+ VIEW  COMPARISON:  None FINDINGS: Osseous mineralization normal. Joint spaces preserved. No acute fracture, dislocation or bone destruction. IMPRESSION: Normal exam. Electronically Signed   By: Ulyses Southward M.D.   On: 02/13/2017 16:54    Procedures Procedures (including critical care time)  Medications Ordered in ED Medications  oxyCODONE-acetaminophen (PERCOCET/ROXICET) 5-325 MG per tablet 2 tablet (2 tablets Oral Given 02/13/17 1807)     Initial Impression / Assessment and Plan / ED Course  I have reviewed the triage vital signs and the nursing notes.  Pertinent labs & imaging results that were available during my care of the patient were reviewed by me and considered in my medical decision making (see chart for  details).     Wrist splint Pt advised ibuprofen Pt advised to see his MD for recheck next week.  An After Visit Summary was printed and given to the patient.   Final Clinical Impressions(s) / ED Diagnoses   Final diagnoses:  Contusion of right wrist, initial encounter    New Prescriptions Discharge Medication List as of 02/13/2017  6:01 PM       Osie Cheeks 02/13/17 1946    Benjiman Core, MD 02/14/17 [phone]

## 2017-02-13 NOTE — Discharge Instructions (Signed)
See your Physician for recheck in 1 week if pain persist °
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
## 2017-12-13 ENCOUNTER — Emergency Department (HOSPITAL_COMMUNITY)
Admission: EM | Admit: 2017-12-13 | Discharge: 2017-12-13 | Disposition: A | Discharge status: Home / Self Care | Payer: Medicaid Other | Attending: Emergency Medicine | Admitting: Emergency Medicine

## 2017-12-13 ENCOUNTER — Emergency Department (HOSPITAL_COMMUNITY): Payer: Medicaid Other

## 2017-12-13 ENCOUNTER — Encounter (HOSPITAL_COMMUNITY): Payer: Self-pay

## 2017-12-13 ENCOUNTER — Other Ambulatory Visit: Payer: Self-pay

## 2017-12-13 DIAGNOSIS — F17210 Nicotine dependence, cigarettes, uncomplicated: Secondary | ICD-10-CM | POA: Insufficient documentation

## 2017-12-13 DIAGNOSIS — Z79899 Other long term (current) drug therapy: Secondary | ICD-10-CM | POA: Diagnosis not present

## 2017-12-13 DIAGNOSIS — Y999 Unspecified external cause status: Secondary | ICD-10-CM | POA: Insufficient documentation

## 2017-12-13 DIAGNOSIS — S63610A Unspecified sprain of right index finger, initial encounter: Secondary | ICD-10-CM | POA: Diagnosis not present

## 2017-12-13 DIAGNOSIS — Y9389 Activity, other specified: Secondary | ICD-10-CM | POA: Diagnosis not present

## 2017-12-13 DIAGNOSIS — S6991XA Unspecified injury of right wrist, hand and finger(s), initial encounter: Secondary | ICD-10-CM | POA: Diagnosis present

## 2017-12-13 DIAGNOSIS — Y929 Unspecified place or not applicable: Secondary | ICD-10-CM | POA: Diagnosis not present

## 2017-12-13 MED ORDER — OXYCODONE-ACETAMINOPHEN 5-325 MG PO TABS
1.0000 | ORAL_TABLET | Freq: Once | ORAL | Status: AC
  Administered 2017-12-13: 1 via ORAL
  Filled 2017-12-13: qty 1
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
## 2017-12-13 NOTE — Discharge Instructions (Addendum)
Keep your finger splinted.  Wear the knee brace as needed for support.  Call the hand surgeon or Dr. Sanjuan Dame office to arrange a follow-up appointment regarding your finger.

## 2017-12-13 NOTE — ED Notes (Signed)
Pt states understanding of d/c instructions and has arranged for transportation home d/t narcotic administration. Pt educated on narcotic impairment.
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
## 2018-06-07 ENCOUNTER — Other Ambulatory Visit (HOSPITAL_COMMUNITY): Payer: Self-pay | Admitting: Family Medicine

## 2018-06-07 ENCOUNTER — Ambulatory Visit (HOSPITAL_COMMUNITY)
Admission: RE | Admit: 2018-06-07 | Discharge: 2018-06-07 | Disposition: A | Discharge status: Home / Self Care | Payer: Medicaid Other | Source: Ambulatory Visit | Attending: Family Medicine | Admitting: Family Medicine

## 2018-06-07 DIAGNOSIS — M419 Scoliosis, unspecified: Secondary | ICD-10-CM | POA: Insufficient documentation

## 2018-06-07 DIAGNOSIS — M546 Pain in thoracic spine: Secondary | ICD-10-CM | POA: Insufficient documentation

## 2018-06-07 DIAGNOSIS — M542 Cervicalgia: Secondary | ICD-10-CM | POA: Diagnosis not present

## 2018-06-07 DIAGNOSIS — M2578 Osteophyte, vertebrae: Secondary | ICD-10-CM | POA: Insufficient documentation

## 2019-01-25 ENCOUNTER — Other Ambulatory Visit: Payer: Self-pay

## 2019-01-25 ENCOUNTER — Other Ambulatory Visit: Payer: Medicaid Other

## 2019-01-25 DIAGNOSIS — Z20822 Contact with and (suspected) exposure to covid-19: Secondary | ICD-10-CM

## 2019-01-31 LAB — NOVEL CORONAVIRUS, NAA: SARS-CoV-2, NAA: NOT DETECTED

## 2019-02-03 ENCOUNTER — Other Ambulatory Visit: Payer: Self-pay

## 2019-02-03 ENCOUNTER — Other Ambulatory Visit: Payer: Medicaid Other

## 2019-02-03 DIAGNOSIS — Z20822 Contact with and (suspected) exposure to covid-19: Secondary | ICD-10-CM

## 2019-02-03 NOTE — Progress Notes (Signed)
lab

## 2019-02-09 LAB — NOVEL CORONAVIRUS, NAA: SARS-CoV-2, NAA: NOT DETECTED

## 2019-03-03 ENCOUNTER — Other Ambulatory Visit: Payer: Self-pay

## 2019-03-03 DIAGNOSIS — Z20822 Contact with and (suspected) exposure to covid-19: Secondary | ICD-10-CM

## 2019-03-04 LAB — NOVEL CORONAVIRUS, NAA: SARS-CoV-2, NAA: NOT DETECTED

## 2019-07-14 ENCOUNTER — Ambulatory Visit: Payer: Medicaid Other | Attending: Internal Medicine

## 2019-07-14 ENCOUNTER — Other Ambulatory Visit: Payer: Self-pay

## 2019-07-14 DIAGNOSIS — Z20822 Contact with and (suspected) exposure to covid-19: Secondary | ICD-10-CM

## 2019-07-15 LAB — NOVEL CORONAVIRUS, NAA: SARS-CoV-2, NAA: NOT DETECTED

## 2019-07-17 ENCOUNTER — Telehealth: Payer: Self-pay | Admitting: *Deleted

## 2019-07-17 NOTE — Telephone Encounter (Signed)
Patient called ,given negative covid results . 

## 2019-08-18 ENCOUNTER — Other Ambulatory Visit: Payer: Self-pay

## 2019-08-18 ENCOUNTER — Ambulatory Visit: Payer: Medicaid Other | Attending: Internal Medicine

## 2019-08-18 DIAGNOSIS — Z20822 Contact with and (suspected) exposure to covid-19: Secondary | ICD-10-CM

## 2019-08-19 LAB — NOVEL CORONAVIRUS, NAA: SARS-CoV-2, NAA: NOT DETECTED

## 2019-08-21 ENCOUNTER — Telehealth: Payer: Self-pay

## 2019-08-21 NOTE — Telephone Encounter (Signed)
Patient called and was informed that his test for COVID-19 was negative 08/18/19. He verbalized understanding.

## 2019-12-22 ENCOUNTER — Encounter (HOSPITAL_COMMUNITY): Payer: Self-pay

## 2019-12-22 ENCOUNTER — Other Ambulatory Visit (HOSPITAL_COMMUNITY): Payer: Self-pay | Admitting: Adult Health

## 2019-12-22 ENCOUNTER — Other Ambulatory Visit: Payer: Self-pay

## 2019-12-22 ENCOUNTER — Ambulatory Visit (HOSPITAL_COMMUNITY)
Admission: RE | Admit: 2019-12-22 | Discharge: 2019-12-22 | Disposition: A | Discharge status: Home / Self Care | Payer: Medicaid Other | Source: Ambulatory Visit | Attending: Adult Health | Admitting: Adult Health

## 2019-12-22 DIAGNOSIS — S0990XA Unspecified injury of head, initial encounter: Secondary | ICD-10-CM

## 2019-12-26 ENCOUNTER — Ambulatory Visit (HOSPITAL_COMMUNITY)
Admission: RE | Admit: 2019-12-26 | Discharge: 2019-12-26 | Disposition: A | Discharge status: Home / Self Care | Payer: Medicaid Other | Source: Ambulatory Visit | Attending: Adult Health | Admitting: Adult Health

## 2019-12-26 ENCOUNTER — Other Ambulatory Visit: Payer: Self-pay

## 2019-12-26 ENCOUNTER — Other Ambulatory Visit (HOSPITAL_COMMUNITY): Payer: Self-pay | Admitting: Adult Health

## 2019-12-26 DIAGNOSIS — S0993XA Unspecified injury of face, initial encounter: Secondary | ICD-10-CM | POA: Diagnosis present

## 2020-01-08 ENCOUNTER — Other Ambulatory Visit: Payer: Self-pay | Admitting: Internal Medicine

## 2020-01-10 ENCOUNTER — Other Ambulatory Visit (HOSPITAL_COMMUNITY): Payer: Self-pay | Admitting: Internal Medicine

## 2020-01-10 ENCOUNTER — Other Ambulatory Visit: Payer: Self-pay | Admitting: Internal Medicine

## 2020-01-10 DIAGNOSIS — S0282XA Fracture of other specified skull and facial bones, left side, initial encounter for closed fracture: Secondary | ICD-10-CM

## 2020-04-12 ENCOUNTER — Other Ambulatory Visit: Payer: Medicaid Other

## 2020-04-12 ENCOUNTER — Other Ambulatory Visit: Payer: Self-pay

## 2020-04-12 DIAGNOSIS — Z20822 Contact with and (suspected) exposure to covid-19: Secondary | ICD-10-CM

## 2020-04-15 ENCOUNTER — Telehealth: Payer: Self-pay | Admitting: Adult Health

## 2020-04-15 LAB — NOVEL CORONAVIRUS, NAA: SARS-CoV-2, NAA: NOT DETECTED

## 2020-04-15 NOTE — Telephone Encounter (Signed)
Negative COVID results given. Patient results "NOT Detected." Caller expressed understanding. ° °

## 2020-07-16 ENCOUNTER — Other Ambulatory Visit: Payer: Medicaid Other

## 2020-08-11 ENCOUNTER — Other Ambulatory Visit: Payer: Self-pay

## 2020-08-11 ENCOUNTER — Emergency Department (HOSPITAL_COMMUNITY): Payer: Medicaid Other

## 2020-08-11 ENCOUNTER — Emergency Department (HOSPITAL_COMMUNITY)
Admission: EM | Admit: 2020-08-11 | Discharge: 2020-08-11 | Disposition: A | Discharge status: Home / Self Care | Payer: Medicaid Other | Attending: Emergency Medicine | Admitting: Emergency Medicine

## 2020-08-11 DIAGNOSIS — M25562 Pain in left knee: Secondary | ICD-10-CM | POA: Insufficient documentation

## 2020-08-11 DIAGNOSIS — S22089A Unspecified fracture of T11-T12 vertebra, initial encounter for closed fracture: Secondary | ICD-10-CM | POA: Insufficient documentation

## 2020-08-11 DIAGNOSIS — Y908 Blood alcohol level of 240 mg/100 ml or more: Secondary | ICD-10-CM | POA: Insufficient documentation

## 2020-08-11 DIAGNOSIS — Y92410 Unspecified street and highway as the place of occurrence of the external cause: Secondary | ICD-10-CM | POA: Insufficient documentation

## 2020-08-11 DIAGNOSIS — S32018A Other fracture of first lumbar vertebra, initial encounter for closed fracture: Secondary | ICD-10-CM

## 2020-08-11 DIAGNOSIS — F17210 Nicotine dependence, cigarettes, uncomplicated: Secondary | ICD-10-CM | POA: Diagnosis not present

## 2020-08-11 DIAGNOSIS — F10129 Alcohol abuse with intoxication, unspecified: Secondary | ICD-10-CM | POA: Insufficient documentation

## 2020-08-11 DIAGNOSIS — Z7982 Long term (current) use of aspirin: Secondary | ICD-10-CM | POA: Diagnosis not present

## 2020-08-11 DIAGNOSIS — S22088A Other fracture of T11-T12 vertebra, initial encounter for closed fracture: Secondary | ICD-10-CM

## 2020-08-11 DIAGNOSIS — Z23 Encounter for immunization: Secondary | ICD-10-CM | POA: Diagnosis not present

## 2020-08-11 DIAGNOSIS — S8992XA Unspecified injury of left lower leg, initial encounter: Secondary | ICD-10-CM | POA: Diagnosis present

## 2020-08-11 LAB — COMPREHENSIVE METABOLIC PANEL
ALT: 81 U/L — ABNORMAL HIGH (ref 0–44)
AST: 62 U/L — ABNORMAL HIGH (ref 15–41)
Albumin: 4.2 g/dL (ref 3.5–5.0)
Alkaline Phosphatase: 67 U/L (ref 38–126)
Anion gap: 9 (ref 5–15)
BUN: 15 mg/dL (ref 6–20)
CO2: 27 mmol/L (ref 22–32)
Calcium: 8.9 mg/dL (ref 8.9–10.3)
Chloride: 101 mmol/L (ref 98–111)
Creatinine, Ser: 1.03 mg/dL (ref 0.61–1.24)
GFR, Estimated: >60 mL/min (ref >60)
Glucose, Bld: 97 mg/dL (ref 70–99)
Potassium: 3.8 mmol/L (ref 3.5–5.1)
Sodium: 137 mmol/L (ref 135–145)
Total Bilirubin: 0.6 mg/dL (ref 0.3–1.2)
Total Protein: 7.7 g/dL (ref 6.5–8.1)

## 2020-08-11 LAB — URINALYSIS, ROUTINE W REFLEX MICROSCOPIC
Bacteria, UA: NONE SEEN
Bilirubin Urine: NEGATIVE
Glucose, UA: NEGATIVE mg/dL
Ketones, ur: NEGATIVE mg/dL
Leukocytes,Ua: NEGATIVE
Nitrite: NEGATIVE
Protein, ur: NEGATIVE mg/dL
Specific Gravity, Urine: 1.003 — ABNORMAL LOW (ref 1.005–1.030)
pH: 6 (ref 5.0–8.0)

## 2020-08-11 LAB — ETHANOL: Alcohol, Ethyl (B): 255 mg/dL — ABNORMAL HIGH (ref <10)

## 2020-08-11 LAB — CBC
HCT: 42.6 % (ref 39.0–52.0)
Hemoglobin: 13.9 g/dL (ref 13.0–17.0)
MCH: 29.9 pg (ref 26.0–34.0)
MCHC: 32.6 g/dL (ref 30.0–36.0)
MCV: 91.6 fL (ref 80.0–100.0)
Platelets: 262 10*3/uL (ref 150–400)
RBC: 4.65 MIL/uL (ref 4.22–5.81)
RDW: 13.6 % (ref 11.5–15.5)
WBC: 7.1 10*3/uL (ref 4.0–10.5)
nRBC: 0 % (ref 0.0–0.2)

## 2020-08-11 LAB — PROTIME-INR
INR: 0.9 (ref 0.8–1.2)
Prothrombin Time: 11.9 seconds (ref 11.4–15.2)

## 2020-08-11 LAB — SAMPLE TO BLOOD BANK

## 2020-08-11 LAB — LACTIC ACID, PLASMA: Lactic Acid, Venous: 1.9 mmol/L (ref 0.5–1.9)

## 2020-08-11 MED ORDER — MORPHINE SULFATE (PF) 4 MG/ML IV SOLN
4.0000 mg | Freq: Once | INTRAVENOUS | Status: AC
  Administered 2020-08-11: 4 mg via INTRAVENOUS
  Filled 2020-08-11: qty 1

## 2020-08-11 MED ORDER — OXYCODONE-ACETAMINOPHEN 5-325 MG PO TABS: 1.0000 | ORAL_TABLET | ORAL | 0 refills | Status: DC | PRN

## 2020-08-11 MED ORDER — IOHEXOL 300 MG/ML  SOLN
100.0000 mL | Freq: Once | INTRAMUSCULAR | Status: AC | PRN
  Administered 2020-08-11: 100 mL via INTRAVENOUS

## 2020-08-11 MED ORDER — TETANUS-DIPHTH-ACELL PERTUSSIS 5-2.5-18.5 LF-MCG/0.5 IM SUSY
0.5000 mL | PREFILLED_SYRINGE | Freq: Once | INTRAMUSCULAR | Status: AC
  Administered 2020-08-11: 0.5 mL via INTRAMUSCULAR
  Filled 2020-08-11: qty 0.5

## 2020-08-11 NOTE — ED Notes (Signed)

## 2020-08-11 NOTE — ED Notes (Signed)
PA at bedside at this time.  

## 2020-08-11 NOTE — Progress Notes (Signed)
Patient ID: GUY SEESE, male   DOB: [birth year], 45 y.o.   MRN: [id number] BP 122/76   Pulse 70   Temp 97.6 F (36.4 C) (Oral)   Resp 19   Ht 6' (1.829 m)   Wt 93 kg   SpO2 94%   BMI 27.80 kg/m  Films reviewed. Will need a tlso, when up. Does not need the brace on in bed. May shower without the brace.  Call the office to make an appointment.

## 2020-08-11 NOTE — ED Notes (Signed)
Called For TLSO Brace at 19:35

## 2020-08-11 NOTE — ED Notes (Signed)
Pt placed in neck brace per provider.

## 2020-08-11 NOTE — ED Notes (Signed)
TSO brace being applied at this time.
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
## 2020-08-11 NOTE — Discharge Instructions (Signed)
Return if any problems.  Schedule to see Dr. Franky Macho next week for recheck.  Wear brace when awake

## 2020-10-14 NOTE — Progress Notes (Signed)
Surgical Instructions    Your procedure is scheduled on Thursday, March 24th  Report to [facility] Main Entrance "A" at 06:45 A.M., then check in with the Admitting office.  Call this number if you have problems the morning of surgery:  [phone]   If you have any questions prior to your surgery date call [phone]: Open Monday-Friday 8am-4pm    Remember:  Do not eat after midnight the night before your surgery  You may drink clear liquids until 05:45 the morning of your surgery.   Clear liquids allowed are: Water, Non-Citrus Juices (without pulp), Carbonated Beverages, Clear Tea, Black Coffee Only, and Gatorade  Patient Instructions  . The night before surgery:  o No food after midnight. ONLY clear liquids after midnight  . The day of surgery (if you do NOT have diabetes):  o Drink ONE (1) Pre-Surgery Clear Ensure by 05:45 the morning of surgery. Drink in one sitting. Do not sip.  o This drink was given to you during your hospital  pre-op appointment visit.  o Nothing else to drink after completing the  Pre-Surgery Clear Ensure.          If you have questions, please contact your surgeon's office.     Take these medicines if needed the morning of surgery with A SIP OF WATER   ALPRAZolam (XANAX) oxyCODONE-acetaminophen (PERCOCET)  As of today, STOP taking any Aspirin (unless otherwise instructed by your surgeon) Aleve, Naproxen, Ibuprofen, Motrin, Advil, Goody's, BC's, all herbal medications, fish oil, and all vitamins.                     Do not wear jewelry, make up, or nail polish            Do not wear lotions, powders, colognes, or deodorant.            Men may shave face and neck.            Do not bring valuables to the hospital.            [facility] is not responsible for any belongings or valuables.  Do NOT Smoke (Tobacco/Vaping) or drink Alcohol 24 hours prior to your procedure If you use a CPAP at night, you may bring all equipment for your overnight  stay.   Contacts, glasses, dentures or bridgework may not be worn into surgery, please bring cases for these belongings   For patients admitted to the hospital, discharge time will be determined by your treatment team.   Patients discharged the day of surgery will not be allowed to drive home, and someone needs to stay with them for 24 hours.    Special instructions:   [place]- Preparing For Surgery  Before surgery, you can play an important role. Because skin is not sterile, your skin needs to be as free of germs as possible. You can reduce the number of germs on your skin by washing with CHG (chlorahexidine gluconate) Soap before surgery.  CHG is an antiseptic cleaner which kills germs and bonds with the skin to continue killing germs even after washing.    Oral Hygiene is also important to reduce your risk of infection.  Remember - BRUSH YOUR TEETH THE MORNING OF SURGERY WITH YOUR REGULAR TOOTHPASTE  Please do not use if you have an allergy to CHG or antibacterial soaps. If your skin becomes reddened/irritated stop using the CHG.  Do not shave (including legs and underarms) for at least 48 hours  prior to first CHG shower. It is OK to shave your face.  Please follow these instructions carefully.   1. Shower the NIGHT BEFORE SURGERY and the MORNING OF SURGERY  2. If you chose to wash your hair, wash your hair first as usual with your normal shampoo.  3. After you shampoo, rinse your hair and body thoroughly to remove the shampoo.  4. Use CHG Soap as you would any other liquid soap. You can apply CHG directly to the skin and wash gently with a scrungie or a clean washcloth.   5. Apply the CHG Soap to your body ONLY FROM THE NECK DOWN.  Do not use on open wounds or open sores. Avoid contact with your eyes, ears, mouth and genitals (private parts). Wash Face and genitals (private parts)  with your normal soap.   6. Wash thoroughly, paying special attention to the area where your  surgery will be performed.  7. Thoroughly rinse your body with warm water from the neck down.  8. DO NOT shower/wash with your normal soap after using and rinsing off the CHG Soap.  9. Pat yourself dry with a CLEAN TOWEL.  10. Wear CLEAN PAJAMAS to bed the night before surgery  11. Place CLEAN SHEETS on your bed the night before your surgery  12. DO NOT SLEEP WITH PETS.   Day of Surgery:  Shower with CHG soap. Wear Clean/Comfortable clothing the morning of surgery Do not apply any deodorants/lotions.   Remember to brush your teeth WITH YOUR REGULAR TOOTHPASTE.   Please read over the following fact sheets that you were given.

## 2020-10-15 ENCOUNTER — Encounter (HOSPITAL_COMMUNITY): Payer: Self-pay

## 2020-10-15 ENCOUNTER — Other Ambulatory Visit: Payer: Self-pay

## 2020-10-15 ENCOUNTER — Encounter (HOSPITAL_COMMUNITY)
Admission: RE | Admit: 2020-10-15 | Discharge: 2020-10-15 | Disposition: A | Discharge status: Home / Self Care | Payer: Medicaid Other | Source: Ambulatory Visit | Attending: Orthopedic Surgery | Admitting: Orthopedic Surgery

## 2020-10-15 DIAGNOSIS — Z20822 Contact with and (suspected) exposure to covid-19: Secondary | ICD-10-CM | POA: Insufficient documentation

## 2020-10-15 DIAGNOSIS — Z01812 Encounter for preprocedural laboratory examination: Secondary | ICD-10-CM | POA: Diagnosis present

## 2020-10-15 LAB — CBC
HCT: 45.8 % (ref 39.0–52.0)
Hemoglobin: 15.2 g/dL (ref 13.0–17.0)
MCH: 29.3 pg (ref 26.0–34.0)
MCHC: 33.2 g/dL (ref 30.0–36.0)
MCV: 88.4 fL (ref 80.0–100.0)
Platelets: 309 10*3/uL (ref 150–400)
RBC: 5.18 MIL/uL (ref 4.22–5.81)
RDW: 13.8 % (ref 11.5–15.5)
WBC: 8.4 10*3/uL (ref 4.0–10.5)
nRBC: 0 % (ref 0.0–0.2)

## 2020-10-15 LAB — COMPREHENSIVE METABOLIC PANEL
ALT: 33 U/L (ref 0–44)
AST: 18 U/L (ref 15–41)
Albumin: 4.4 g/dL (ref 3.5–5.0)
Alkaline Phosphatase: 71 U/L (ref 38–126)
Anion gap: 9 (ref 5–15)
BUN: 10 mg/dL (ref 6–20)
CO2: 26 mmol/L (ref 22–32)
Calcium: 9.5 mg/dL (ref 8.9–10.3)
Chloride: 105 mmol/L (ref 98–111)
Creatinine, Ser: 1.05 mg/dL (ref 0.61–1.24)
GFR, Estimated: >60 mL/min (ref >60)
Glucose, Bld: 109 mg/dL — ABNORMAL HIGH (ref 70–99)
Potassium: 4.4 mmol/L (ref 3.5–5.1)
Sodium: 140 mmol/L (ref 135–145)
Total Bilirubin: 0.6 mg/dL (ref 0.3–1.2)
Total Protein: 7.2 g/dL (ref 6.5–8.1)

## 2020-10-15 LAB — SARS CORONAVIRUS 2 (TAT 6-24 HRS): SARS Coronavirus 2: NEGATIVE

## 2020-10-15 NOTE — Progress Notes (Signed)
PCP - Colleen Can, NP Cardiologist - denies  Chest x-ray - n/a EKG - n/a Stress Test - denies ECHO - denies Cardiac Cath - denies  Sleep Study - denies CPAP - denies  Blood Thinner Instructions: n/a Aspirin Instructions:n/a  ERAS Protcol - pt is to stop clear liquids by 0545 DOS. PRE-SURGERY Ensure or G2- Pt given (1) pre-surgery ensure.   COVID TEST- 10/15/20; pt aware to quarantine until surgery.   Anesthesia review: No  Patient denies shortness of breath, fever, cough and chest pain at PAT appointment   All instructions explained to the patient, with a verbal understanding of the material. Patient agrees to go over the instructions while at home for a better understanding. Patient also instructed to self quarantine after being tested for COVID-19. The opportunity to ask questions was provided.

## 2020-10-16 NOTE — Anesthesia Preprocedure Evaluation (Addendum)
Anesthesia Evaluation  Patient identified by MRN, date of birth, ID band Patient awake    Reviewed: Allergy & Precautions, NPO status , Patient's Chart, lab work & pertinent test results  History of Anesthesia Complications Negative for: history of anesthetic complications  Airway Mallampati: I  TM Distance: >3 FB Neck ROM: Full    Dental  (+) Teeth Intact   Pulmonary Current Smoker,    Pulmonary exam normal        Cardiovascular negative cardio ROS Normal cardiovascular exam     Neuro/Psych negative neurological ROS     GI/Hepatic negative GI ROS, Neg liver ROS,   Endo/Other  negative endocrine ROS  Renal/GU negative Renal ROS  negative genitourinary   Musculoskeletal negative musculoskeletal ROS (+)   Abdominal   Peds  Hematology negative hematology ROS (+)   Anesthesia Other Findings   Reproductive/Obstetrics                            Anesthesia Physical Anesthesia Plan  ASA: II  Anesthesia Plan: General   Post-op Pain Management: GA combined w/ Regional for post-op pain   Induction: Intravenous  PONV Risk Score and Plan: 1 and Ondansetron, Dexamethasone, Midazolam and Treatment may vary due to age or medical condition  Airway Management Planned: LMA  Additional Equipment: None  Intra-op Plan:   Post-operative Plan: Extubation in OR  Informed Consent: I have reviewed the patients History and Physical, chart, labs and discussed the procedure including the risks, benefits and alternatives for the proposed anesthesia with the patient or authorized representative who has indicated his/her understanding and acceptance.     Dental advisory given  Plan Discussed with:   Anesthesia Plan Comments:        Anesthesia Quick Evaluation

## 2020-10-17 ENCOUNTER — Other Ambulatory Visit: Payer: Self-pay

## 2020-10-17 ENCOUNTER — Ambulatory Visit (HOSPITAL_COMMUNITY): Payer: Medicaid Other | Admitting: Anesthesiology

## 2020-10-17 ENCOUNTER — Encounter (HOSPITAL_COMMUNITY)
Admission: RE | Disposition: A | Discharge status: Home / Self Care | Payer: Self-pay | Source: Ambulatory Visit | Attending: Orthopedic Surgery

## 2020-10-17 ENCOUNTER — Ambulatory Visit (HOSPITAL_COMMUNITY): Payer: Medicaid Other

## 2020-10-17 ENCOUNTER — Ambulatory Visit (HOSPITAL_COMMUNITY)
Admission: RE | Admit: 2020-10-17 | Discharge: 2020-10-17 | Disposition: A | Discharge status: Home / Self Care | Payer: Medicaid Other | Source: Ambulatory Visit | Attending: Orthopedic Surgery | Admitting: Orthopedic Surgery

## 2020-10-17 ENCOUNTER — Encounter (HOSPITAL_COMMUNITY): Payer: Self-pay | Admitting: Orthopedic Surgery

## 2020-10-17 DIAGNOSIS — S83522A Sprain of posterior cruciate ligament of left knee, initial encounter: Secondary | ICD-10-CM | POA: Insufficient documentation

## 2020-10-17 DIAGNOSIS — S83512A Sprain of anterior cruciate ligament of left knee, initial encounter: Secondary | ICD-10-CM | POA: Diagnosis present

## 2020-10-17 DIAGNOSIS — S83242A Other tear of medial meniscus, current injury, left knee, initial encounter: Secondary | ICD-10-CM | POA: Diagnosis not present

## 2020-10-17 DIAGNOSIS — M65862 Other synovitis and tenosynovitis, left lower leg: Secondary | ICD-10-CM | POA: Diagnosis not present

## 2020-10-17 DIAGNOSIS — F17210 Nicotine dependence, cigarettes, uncomplicated: Secondary | ICD-10-CM | POA: Diagnosis not present

## 2020-10-17 DIAGNOSIS — S83422A Sprain of lateral collateral ligament of left knee, initial encounter: Secondary | ICD-10-CM | POA: Diagnosis not present

## 2020-10-17 DIAGNOSIS — X58XXXA Exposure to other specified factors, initial encounter: Secondary | ICD-10-CM | POA: Insufficient documentation

## 2020-10-17 DIAGNOSIS — Z419 Encounter for procedure for purposes other than remedying health state, unspecified: Secondary | ICD-10-CM

## 2020-10-17 HISTORY — PX: KNEE ARTHROSCOPY WITH ANTERIOR CRUCIATE LIGAMENT (ACL) REPAIR WITH HAMSTRING GRAFT: SHX5645

## 2020-10-17 SURGERY — KNEE ARTHROSCOPY WITH ANTERIOR CRUCIATE LIGAMENT (ACL) REPAIR WITH HAMSTRING GRAFT
Anesthesia: Regional | Laterality: Left

## 2020-10-17 MED ORDER — METHOCARBAMOL 500 MG PO TABS: 500.0000 mg | ORAL_TABLET | Freq: Four times a day (QID) | ORAL | 0 refills | Status: DC | PRN

## 2020-10-17 MED ORDER — HYDROMORPHONE HCL 1 MG/ML IJ SOLN
0.2500 mg | INTRAMUSCULAR | Status: DC | PRN
  Administered 2020-10-17 (×2): 0.5 mg via INTRAVENOUS

## 2020-10-17 MED ORDER — OXYCODONE HCL 5 MG/5ML PO SOLN: 5.0000 mg | Freq: Once | ORAL | Status: DC | PRN

## 2020-10-17 MED ORDER — CHLORHEXIDINE GLUCONATE 0.12 % MT SOLN
15.0000 mL | Freq: Once | OROMUCOSAL | Status: AC
  Administered 2020-10-17: 15 mL via OROMUCOSAL
  Filled 2020-10-17: qty 15

## 2020-10-17 MED ORDER — DEXAMETHASONE SODIUM PHOSPHATE 10 MG/ML IJ SOLN
INTRAMUSCULAR | Status: DC | PRN
  Administered 2020-10-17: 10 mg via INTRAVENOUS

## 2020-10-17 MED ORDER — HYDROMORPHONE HCL 1 MG/ML IJ SOLN
INTRAMUSCULAR | Status: AC
  Filled 2020-10-17: qty 1

## 2020-10-17 MED ORDER — LACTATED RINGERS IV SOLN: INTRAVENOUS | Status: DC

## 2020-10-17 MED ORDER — ORAL CARE MOUTH RINSE: 15.0000 mL | Freq: Once | OROMUCOSAL | Status: AC

## 2020-10-17 MED ORDER — FENTANYL CITRATE (PF) 250 MCG/5ML IJ SOLN
INTRAMUSCULAR | Status: DC | PRN
  Administered 2020-10-17 (×3): 50 ug via INTRAVENOUS
  Administered 2020-10-17: 100 ug via INTRAVENOUS

## 2020-10-17 MED ORDER — MIDAZOLAM HCL 2 MG/2ML IJ SOLN
INTRAMUSCULAR | Status: DC | PRN
  Administered 2020-10-17 (×2): 2 mg via INTRAVENOUS

## 2020-10-17 MED ORDER — DEXMEDETOMIDINE (PRECEDEX) IN NS 20 MCG/5ML (4 MCG/ML) IV SYRINGE
PREFILLED_SYRINGE | INTRAVENOUS | Status: AC
  Filled 2020-10-17: qty 5

## 2020-10-17 MED ORDER — FENTANYL CITRATE (PF) 250 MCG/5ML IJ SOLN
INTRAMUSCULAR | Status: AC
  Filled 2020-10-17: qty 5

## 2020-10-17 MED ORDER — BUPIVACAINE HCL (PF) 0.25 % IJ SOLN
INTRAMUSCULAR | Status: AC
  Filled 2020-10-17: qty 30

## 2020-10-17 MED ORDER — EPINEPHRINE PF 1 MG/ML IJ SOLN
INTRAMUSCULAR | Status: AC
  Filled 2020-10-17: qty 1

## 2020-10-17 MED ORDER — ONDANSETRON 4 MG PO TBDP: 4.0000 mg | ORAL_TABLET | Freq: Three times a day (TID) | ORAL | 0 refills | Status: DC | PRN

## 2020-10-17 MED ORDER — ONDANSETRON HCL 4 MG/2ML IJ SOLN: 4.0000 mg | Freq: Once | INTRAMUSCULAR | Status: DC | PRN

## 2020-10-17 MED ORDER — MIDAZOLAM HCL 2 MG/2ML IJ SOLN
INTRAMUSCULAR | Status: AC
  Filled 2020-10-17: qty 2

## 2020-10-17 MED ORDER — CEFAZOLIN SODIUM-DEXTROSE 2-4 GM/100ML-% IV SOLN
2.0000 g | INTRAVENOUS | Status: AC
  Administered 2020-10-17: 2 g via INTRAVENOUS
  Filled 2020-10-17: qty 100

## 2020-10-17 MED ORDER — KETAMINE HCL 50 MG/5ML IJ SOSY
PREFILLED_SYRINGE | INTRAMUSCULAR | Status: AC
  Filled 2020-10-17: qty 5

## 2020-10-17 MED ORDER — PHENYLEPHRINE HCL-NACL 10-0.9 MG/250ML-% IV SOLN
INTRAVENOUS | Status: AC
  Filled 2020-10-17: qty 500

## 2020-10-17 MED ORDER — ROPIVACAINE HCL 5 MG/ML IJ SOLN
INTRAMUSCULAR | Status: DC | PRN
  Administered 2020-10-17: 30 mL via PERINEURAL

## 2020-10-17 MED ORDER — PROPOFOL 1000 MG/100ML IV EMUL
INTRAVENOUS | Status: AC
  Filled 2020-10-17: qty 200

## 2020-10-17 MED ORDER — LIDOCAINE 2% (20 MG/ML) 5 ML SYRINGE
INTRAMUSCULAR | Status: DC | PRN
  Administered 2020-10-17: 10 mg via INTRAVENOUS

## 2020-10-17 MED ORDER — SODIUM CHLORIDE 0.9 % IR SOLN
Status: DC | PRN
  Administered 2020-10-17 (×6): 3000 mL

## 2020-10-17 MED ORDER — DEXMEDETOMIDINE (PRECEDEX) IN NS 20 MCG/5ML (4 MCG/ML) IV SYRINGE
PREFILLED_SYRINGE | INTRAVENOUS | Status: DC | PRN
  Administered 2020-10-17: 8 ug via INTRAVENOUS
  Administered 2020-10-17: 12 ug via INTRAVENOUS

## 2020-10-17 MED ORDER — KETAMINE HCL 10 MG/ML IJ SOLN
INTRAMUSCULAR | Status: DC | PRN
  Administered 2020-10-17 (×2): 10 mg via INTRAVENOUS
  Administered 2020-10-17: 30 mg via INTRAVENOUS

## 2020-10-17 MED ORDER — AMISULPRIDE (ANTIEMETIC) 5 MG/2ML IV SOLN: 10.0000 mg | Freq: Once | INTRAVENOUS | Status: DC | PRN

## 2020-10-17 MED ORDER — BUPIVACAINE-EPINEPHRINE 0.25% -1:200000 IJ SOLN
INTRAMUSCULAR | Status: DC | PRN
  Administered 2020-10-17: 30 mL

## 2020-10-17 MED ORDER — PROPOFOL 10 MG/ML IV BOLUS
INTRAVENOUS | Status: AC
  Filled 2020-10-17: qty 40

## 2020-10-17 MED ORDER — DEXAMETHASONE SODIUM PHOSPHATE 10 MG/ML IJ SOLN
INTRAMUSCULAR | Status: AC
  Filled 2020-10-17: qty 1

## 2020-10-17 MED ORDER — ONDANSETRON HCL 4 MG/2ML IJ SOLN
INTRAMUSCULAR | Status: DC | PRN
  Administered 2020-10-17: 4 mg via INTRAVENOUS

## 2020-10-17 MED ORDER — OXYCODONE HCL 5 MG PO TABS: 5.0000 mg | ORAL_TABLET | Freq: Once | ORAL | Status: DC | PRN

## 2020-10-17 MED ORDER — DEXAMETHASONE SODIUM PHOSPHATE 10 MG/ML IJ SOLN
INTRAMUSCULAR | Status: DC | PRN
  Administered 2020-10-17: 5 mg

## 2020-10-17 MED ORDER — PROPOFOL 10 MG/ML IV BOLUS
INTRAVENOUS | Status: DC | PRN
Start: 2020-10-17 — End: 2020-10-17
  Administered 2020-10-17: 200 mg via INTRAVENOUS
  Administered 2020-10-17: 110 mg via INTRAVENOUS

## 2020-10-17 MED ORDER — ONDANSETRON HCL 4 MG/2ML IJ SOLN
INTRAMUSCULAR | Status: AC
  Filled 2020-10-17: qty 2

## 2020-10-17 MED ORDER — LIDOCAINE 2% (20 MG/ML) 5 ML SYRINGE
INTRAMUSCULAR | Status: AC
  Filled 2020-10-17: qty 5

## 2020-10-17 SURGICAL SUPPLY — 54 items
ALLOGRAFT GRFTLNK IMPLANT SYST (Anchor) ×1 IMPLANT
ANCHOR BUTTON TIGHTROPE 14 (Button) ×2 IMPLANT
ANCHOR BUTTON TIGHTROPE II FT (Plate) ×4 IMPLANT
ANCHOR BUTTON TIGHTROPE II OP (Plate) ×4 IMPLANT
ANCHOR BUTTON TIGHTROPE RN 14 (Anchor) ×2 IMPLANT
ANCHOR PEEK 4.75X19.1 SWLK C (Anchor) ×2 IMPLANT
BLADE SHAVER TORPEDO 4X13 (MISCELLANEOUS) ×2 IMPLANT
BNDG ELASTIC 6X10 VLCR STRL LF (GAUZE/BANDAGES/DRESSINGS) ×2 IMPLANT
BNDG ELASTIC 6X5.8 VLCR STR LF (GAUZE/BANDAGES/DRESSINGS) ×2 IMPLANT
CANNULA 5.75X71 LONG (CANNULA) ×2 IMPLANT
CLSR STERI-STRIP ANTIMIC 1/2X4 (GAUZE/BANDAGES/DRESSINGS) ×2 IMPLANT
COVER WAND RF STERILE (DRAPES) ×2 IMPLANT
CUTTER BONE 4.0MM X 13CM (MISCELLANEOUS) ×2 IMPLANT
DRAPE ARTHROSCOPY W/POUCH 114 (DRAPES) ×2 IMPLANT
DRAPE C-ARM 42X72 X-RAY (DRAPES) ×2 IMPLANT
DRAPE U-SHAPE 47X51 STRL (DRAPES) ×2 IMPLANT
DRILL FLIPCUTTER III 6-12 (ORTHOPEDIC DISPOSABLE SUPPLIES) ×1 IMPLANT
DRSG PAD ABDOMINAL 8X10 ST (GAUZE/BANDAGES/DRESSINGS) ×2 IMPLANT
DRSG XEROFORM 1X8 (GAUZE/BANDAGES/DRESSINGS) ×2 IMPLANT
DURAPREP 26ML APPLICATOR (WOUND CARE) ×2 IMPLANT
FIBERSTICK 2 (SUTURE) ×4 IMPLANT
FLIPCUTTER III 6-12 AR-1204FF (ORTHOPEDIC DISPOSABLE SUPPLIES) ×2
GAUZE 4X4 16PLY RFD (DISPOSABLE) ×2 IMPLANT
GAUZE SPONGE 4X4 12PLY STRL (GAUZE/BANDAGES/DRESSINGS) ×2 IMPLANT
GAUZE XEROFORM 1X8 LF (GAUZE/BANDAGES/DRESSINGS) ×2 IMPLANT
GLOVE BIO SURGEON STRL SZ7.5 (GLOVE) ×4 IMPLANT
GLOVE SRG 8 PF TXTR STRL LF DI (GLOVE) ×2 IMPLANT
GLOVE SURG UNDER POLY LF SZ8 (GLOVE) ×4
GOWN STRL REUS W/ TWL LRG LVL3 (GOWN DISPOSABLE) ×1 IMPLANT
GOWN STRL REUS W/ TWL XL LVL3 (GOWN DISPOSABLE) ×2 IMPLANT
GOWN STRL REUS W/TWL LRG LVL3 (GOWN DISPOSABLE) ×2
GOWN STRL REUS W/TWL XL LVL3 (GOWN DISPOSABLE) ×4
IMP SYS 2ND FIX PEEK 4.75X19.1 (Miscellaneous) ×4 IMPLANT
IMPL SYS 2ND FX PEEK 4.75X19.1 (Miscellaneous) ×2 IMPLANT
KIT BASIN OR (CUSTOM PROCEDURE TRAY) ×2 IMPLANT
MANIFOLD NEPTUNE II (INSTRUMENTS) ×2 IMPLANT
NEEDLE PRECISIONGLIDE 27X1.5 (NEEDLE) ×2 IMPLANT
PACK ARTHROSCOPY DSU (CUSTOM PROCEDURE TRAY) ×2 IMPLANT
PAD ABD 8X10 STRL (GAUZE/BANDAGES/DRESSINGS) ×4 IMPLANT
PADDING CAST COTTON 6X4 STRL (CAST SUPPLIES) ×2 IMPLANT
PK GRAFTLINK ALLO IMPLANT SYST (Anchor) ×2 IMPLANT
SUT ETHILON 4 0 PS 2 18 (SUTURE) ×2 IMPLANT
SUT MON AB 3-0 SH 27 (SUTURE) ×2
SUT MON AB 3-0 SH27 (SUTURE) ×1 IMPLANT
SUT VIC AB 2-0 CT1 36 (SUTURE) ×2 IMPLANT
SUT VICRYL 0 UR6 27IN ABS (SUTURE) ×2 IMPLANT
SUTURE TAPE TIGERLINK 1.3MM BL (SUTURE) ×2 IMPLANT
SUTURETAPE TIGERLINK 1.3MM BL (SUTURE) ×4
SYR CONTROL 10ML LL (SYRINGE) ×2 IMPLANT
TISSUE GRAFTLINK 65-95MML (Tissue) ×2 IMPLANT
TISSUE GRAFTLINK FGL (Tissue) ×2 IMPLANT
TOWEL GREEN STERILE (TOWEL DISPOSABLE) ×2 IMPLANT
TUBING ARTHROSCOPY IRRIG 16FT (MISCELLANEOUS) ×2 IMPLANT
WRAP KNEE MAXI GEL POST OP (GAUZE/BANDAGES/DRESSINGS) ×2 IMPLANT

## 2020-10-17 NOTE — Progress Notes (Signed)
Orthopedic Tech Progress Note Patient Details:  Adam Wyatt 1976/07/18 [id number]  Ortho Devices Type of Ortho Device: Crutches Ortho Device/Splint Interventions: Ordered   Post Interventions Patient Tolerated: Other (comment) Instructions Provided: Other (comment)   Michelle Piper 10/17/2020, 3:16 PM

## 2020-10-17 NOTE — Progress Notes (Signed)
Orthopedic Tech Progress Note Patient Details:  Adam Wyatt February 14, 1976 [id number] Called in order to [facility] Patient ID: Adam Wyatt, male   DOB: 02/03/1976, 45 y.o.   MRN: [id number]   Lovett Calender 10/17/2020, 10:21 AM

## 2020-10-17 NOTE — Anesthesia Procedure Notes (Signed)
Procedure Name: LMA Insertion Date/Time: 10/17/2020 7:54 AM Performed by: Onnie Boer, CRNA Pre-anesthesia Checklist: Patient identified, Emergency Drugs available, Suction available and Patient being monitored Patient Re-evaluated:Patient Re-evaluated prior to induction Oxygen Delivery Method: Circle system utilized Preoxygenation: Pre-oxygenation with 100% oxygen Induction Type: IV induction Ventilation: Mask ventilation without difficulty LMA: LMA inserted LMA Size: 5.0 Number of attempts: 1 Airway Equipment and Method: Bite block Placement Confirmation: positive ETCO2 and breath sounds checked- equal and bilateral Tube secured with: Tape Dental Injury: Teeth and Oropharynx as per pre-operative assessment

## 2020-10-17 NOTE — Anesthesia Postprocedure Evaluation (Signed)
Anesthesia Post Note  Patient: Adam Wyatt  Procedure(s) Performed: LEFT KNEE ARTHROSCOPY WITH ANTERIOR CRUCIATE LIGAMENT (ACL), POSTERIOR CRUCIATE LIGAMENT, LATERAL COLLATERAL LIGAMENT REPAIR WITH HAMSTRING GRAFT, POSSIBLE MEIDAL MENISCECTOMY (Left )     Patient location during evaluation: PACU Anesthesia Type: Regional and General Level of consciousness: awake and alert Pain management: pain level controlled Vital Signs Assessment: post-procedure vital signs reviewed and stable Respiratory status: spontaneous breathing, nonlabored ventilation and respiratory function stable Cardiovascular status: blood pressure returned to baseline and stable Postop Assessment: no apparent nausea or vomiting Anesthetic complications: no   No complications documented.  Last Vitals:  Vitals:   10/17/20 1155 10/17/20 1210  BP: 110/68 116/70  Pulse: 74 78  Resp: 12 16  Temp:  (!) 36.1 C  SpO2: 98% 98%    Last Pain:  Vitals:   10/17/20 1210  TempSrc:   PainSc: 4                  Lucretia Kern

## 2020-10-17 NOTE — Transfer of Care (Signed)
Immediate Anesthesia Transfer of Care Note  Patient: Adam Wyatt  Procedure(s) Performed: LEFT KNEE ARTHROSCOPY WITH ANTERIOR CRUCIATE LIGAMENT (ACL), POSTERIOR CRUCIATE LIGAMENT, LATERAL COLLATERAL LIGAMENT REPAIR WITH HAMSTRING GRAFT, POSSIBLE MEIDAL MENISCECTOMY (Left )  Patient Location: PACU  Anesthesia Type:General and Regional  Level of Consciousness: drowsy and patient cooperative  Airway & Oxygen Therapy: Patient Spontanous Breathing and Patient connected to face mask oxygen  Post-op Assessment: Report given to RN and Post -op Vital signs reviewed and stable  Post vital signs: Reviewed and stable  Last Vitals:  Vitals Value Taken Time  BP 111/79 10/17/20 1110  Temp 36.1 C 10/17/20 1110  Pulse 67 10/17/20 1110  Resp 14 10/17/20 1110  SpO2 100 % 10/17/20 1110    Last Pain:  Vitals:   10/17/20 0710  TempSrc:   PainSc: 8       Patients Stated Pain Goal: 2 (10/17/20 0710)  Complications: No complications documented.

## 2020-10-17 NOTE — Anesthesia Procedure Notes (Signed)
Anesthesia Regional Block: Femoral nerve block   Pre-Anesthetic Checklist: ,, timeout performed, Correct Patient, Correct Site, Correct Laterality, Correct Procedure, Correct Position, site marked, Risks and benefits discussed,  Surgical consent,  Pre-op evaluation,  At surgeon's request and post-op pain management  Laterality: Left  Prep: chloraprep       Needles:  Injection technique: Single-shot  Needle Type: Echogenic Stimulator Needle     Needle Length: 10cm  Needle Gauge: 20     Additional Needles:   Procedures:,,,, ultrasound used (permanent image in chart),,,,  Narrative:  Start time: 10/17/2020 7:14 AM End time: 10/17/2020 7:18 AM Injection made incrementally with aspirations every 5 mL.  Performed by: Personally  Anesthesiologist: Lucretia Kern, MD  Additional Notes: Standard monitors applied. Skin prepped. Good needle visualization with ultrasound. Injection made in 5cc increments with no resistance to injection. Patient tolerated the procedure well.

## 2020-10-17 NOTE — H&P (Signed)
ORTHOPAEDIC H and P  REQUESTING PHYSICIAN: Yolonda Kida, MD  PCP:  Kara Pacer, NP  Chief Complaint: Left knee injury  HPI: Adam Wyatt is a 45 y.o. male who complains of left knee instability and pain.  He is here today for arthroscopic treatment of his ACL and PCL possible medial meniscus.  No new complaints.  Past Medical History:  Diagnosis Date  . Scoliosis    Past Surgical History:  Procedure Laterality Date  . CARPAL TUNNEL RELEASE    . FRACTURE SURGERY    . MANDIBLE SURGERY    . ORTHOPEDIC SURGERY     pt has two screws in right knee, place in left arm   . TONSILLECTOMY     Social History   Socioeconomic History  . Marital status: Single    Spouse name: Not on file  . Number of children: Not on file  . Years of education: Not on file  . Highest education level: Not on file  Occupational History  . Not on file  Tobacco Use  . Smoking status: Current Every Day Smoker    Packs/day: 0.50    Types: Cigarettes  . Smokeless tobacco: Never Used  Vaping Use  . Vaping Use: Never used  Substance and Sexual Activity  . Alcohol use: Yes    Alcohol/week: 4.0 standard drinks    Types: 4 Cans of beer per week    Comment: occasional  . Drug use: No  . Sexual activity: Not on file  Other Topics Concern  . Not on file  Social History Narrative  . Not on file   Social Determinants of Health   Financial Resource Strain: Not on file  Food Insecurity: Not on file  Transportation Needs: Not on file  Physical Activity: Not on file  Stress: Not on file  Social Connections: Not on file   History reviewed. No pertinent family history. Allergies  Allergen Reactions  . Codeine     HYDRO-CODONE; CAUSES ITCHING.   Prior to Admission medications   Medication Sig Start Date End Date Taking? Authorizing Provider  ALPRAZolam Prudy Feeler) 0.5 MG tablet Take 0.5 mg by mouth at bedtime. Takes 0.5 daily if needed during the day   Yes [provider]  oxyCODONE-acetaminophen (PERCOCET) 5-325 MG tablet Take 1 tablet by mouth every 4 (four) hours as needed for severe pain. 08/11/20 08/11/21 Yes Cheron Schaumann K, PA-C   No results found.  Positive ROS: All other systems have been reviewed and were otherwise negative with the exception of those mentioned in the HPI and as above.  Physical Exam: General: Alert, no acute distress Cardiovascular: No pedal edema Respiratory: No cyanosis, no use of accessory musculature GI: No organomegaly, abdomen is soft and non-tender Skin: No lesions in the area of chief complaint Neurologic: Sensation intact distally Psychiatric: Patient is competent for consent with normal mood and affect Lymphatic: No axillary or cervical lymphadenopathy  MUSCULOSKELETAL:  Left leg is warm and well-perfused with no open wounds.  Neurovascular intact.  Assessment: 1.  Left knee ACL tear, complete 2.  Left knee PCL tear, complete 3.  Left knee posterior lateral corner injury 4.  Left knee medial meniscus tear  Plan: -Plan will be for arthroscopic stabilization of the cruciate ligaments with ACL and PCL reconstruction using hamstring allograft.  We will stage the posterior lateral corner and address that at a later date if necessary.  We will also arthroscopically evaluate the medial meniscus likely perform partial medial  meniscectomy.  -We again discussed the recommendation for allograft reconstructions.  He has provided informed consent.  We discussed the risk of bleeding, infection, damage to surrounding nerves and vessels, stiffness, failure of repair, potential need for second stage procedure, and the risk of DVTs.  -Plan on discharge home postoperatively.    Yolonda Kida, MD Cell [phone]    10/17/2020 7:17 AM
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
## 2020-10-17 NOTE — Discharge Instructions (Signed)
DISCHARGE INSTRUCTIONS: ________________________________________________________________________________ ACL RECONSTRUCTION HOME EXERCISE PROGRAM (0-2 WEEKS)   1. Elevate the leg above your heart as often as possible. 2. Weight bear as tolerated with the immobilizer.  Use crutches as needed, progress from 2 to 1 crutch as able using one crutch on opposite side of surgical knee.  Do not limp and do not walk too much!! 3. Wear immobilizer all the time except when exercising.  Wear immobilizer at night!! 4. Start normal showering according to your surgeon's instructions. 5. Goals for first two weeks:  minimal swelling, motion 0-90, walking with immobilizer without crutches and positive attitude about PT. 6. Exercise program to be performed as soon as as able 3-4 times per day followed by ice pack or ice bag for 20 minutes. A. Sitting over edge of bed or counter - Passive Range of Motion using opposite leg to bend and straighten knee as much as possible (5-10 minutes) B. Thigh tensing exercise - Push the knee into a towel roll and try to raise heel slightly off floor.  Hold for 8 seconds, rest 10 seconds. (15 times every hour) C. Straight leg raise lying on your back with opposite knee bent, keeping surgical knee as straight as possible.  You may do with knee immobilizer initially (3-5 sets of ten) D. Hamstring tensing - Dig heels into floor as if you were attempting to bend knee.  Do not allow knee to bend.  Hold for 8 seconds, rest 10 seconds E. Towel stretch - Towel around ball of foot stretching calf by pulling the ankle back while gradually leaning forward to stretch the back of the knee and thigh.  (Hold 20 seconds, 4 times each) F. Back of knee stretch - While lying on stomach, knee just over edge of bed (hold 2 minutes, 4 times) 7. Use pain medication as needed.  To prevent constipation use Colace 100mg . twice a day while on pain medication.  If constipated, use Miralax 17 gm once a day and  drink plenty of fluids.  These medications can be obtained at the pharmacy without a prescription.   8. Follow up in the office in 14 days. Leave dressing in place and reinforce as needed.  You may remove the dressing on postoperative day #3.  You may begin showering at that time.  Do not remove the Steri-Strips.  He can reapply a standard dry dressing.

## 2020-10-17 NOTE — Brief Op Note (Signed)
10/17/2020  10:45 AM  PATIENT:  Adam Wyatt  45 y.o. male  PRE-OPERATIVE DIAGNOSIS:  Left knee anterior cruciate ligament, posterior cruciate ligament, lateral collateral ligament, medial meniscus tear  POST-OPERATIVE DIAGNOSIS:  1.  Left knee posterior cruciate ligament tear. 2.  Left knee anterior cruciate ligament tear 3.  Left knee lateral collateral ligament tear 4.  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  Left knee arthroscopic posterior cruciate ligament reconstruction with hamstring allograft 2.  Left knee arthroscopic anterior cruciate ligament reconstruction with hamstring allograft 3.  Left knee arthroscopic chondroplasty of medial femoral condyle with for debridement.  SURGEON:  Surgeon(s) and Role:    * Lennis Korb, Noah Delaine, MD - Primary  PHYSICIAN ASSISTANT: Dion Saucier, PA-C  ANESTHESIA:   local, regional and general  EBL: 30 cc    BLOOD ADMINISTERED:none  DRAINS: none   LOCAL MEDICATIONS USED:  MARCAINE     SPECIMEN:  No Specimen  DISPOSITION OF SPECIMEN:  N/A  COUNTS:  YES  TOURNIQUET:  * Missing tourniquet times found for documented tourniquets in log: [id number] *  DICTATION: .Note written in EPIC  PLAN OF CARE: Discharge to home after PACU  PATIENT DISPOSITION:  PACU - hemodynamically stable.   Delay start of Pharmacological VTE agent (>24hrs) due to surgical blood loss or risk of bleeding: not applicable

## 2020-10-19 ENCOUNTER — Encounter (HOSPITAL_COMMUNITY): Payer: Self-pay | Admitting: Orthopedic Surgery

## 2020-10-24 ENCOUNTER — Ambulatory Visit (HOSPITAL_COMMUNITY): Payer: Medicaid Other | Admitting: Physical Therapy

## 2020-10-28 ENCOUNTER — Other Ambulatory Visit: Payer: Self-pay

## 2020-10-28 ENCOUNTER — Ambulatory Visit (HOSPITAL_COMMUNITY): Payer: Medicaid Other | Attending: Physician Assistant | Admitting: Physical Therapy

## 2020-10-28 ENCOUNTER — Encounter (HOSPITAL_COMMUNITY): Payer: Self-pay | Admitting: Physical Therapy

## 2020-10-28 DIAGNOSIS — M25562 Pain in left knee: Secondary | ICD-10-CM | POA: Diagnosis not present

## 2020-10-28 DIAGNOSIS — R29898 Other symptoms and signs involving the musculoskeletal system: Secondary | ICD-10-CM | POA: Diagnosis present

## 2020-10-28 DIAGNOSIS — R2689 Other abnormalities of gait and mobility: Secondary | ICD-10-CM | POA: Insufficient documentation

## 2020-10-28 DIAGNOSIS — M25662 Stiffness of left knee, not elsewhere classified: Secondary | ICD-10-CM | POA: Diagnosis present

## 2020-10-28 DIAGNOSIS — M6281 Muscle weakness (generalized): Secondary | ICD-10-CM | POA: Diagnosis present
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
## 2020-10-28 NOTE — Patient Instructions (Signed)
Access Code: [email or web address] URL: [email or web address] Date: 10/28/2020 Prepared by: Greig Castilla Janayah Zavada  Exercises Supine Ankle Pumps - 5 x daily - 7 x weekly - 3 sets - 10 reps Long Sitting Quad Set - 5 x daily - 7 x weekly - 10 reps - 10 second hold Active Straight Leg Raise with Quad Set - 3 x daily - 7 x weekly - 3 sets - 10 reps Sidelying Hip Abduction - 3 x daily - 7 x weekly - 3 sets - 10 reps Supine Heel Slide with Strap - 5 x daily - 7 x weekly - 10 reps - 10 second hold

## 2020-10-30 ENCOUNTER — Ambulatory Visit (HOSPITAL_COMMUNITY): Payer: Medicaid Other | Admitting: Physical Therapy

## 2020-11-01 ENCOUNTER — Encounter (HOSPITAL_COMMUNITY): Payer: Self-pay | Admitting: Physical Therapy

## 2020-11-01 ENCOUNTER — Ambulatory Visit (HOSPITAL_COMMUNITY): Payer: Medicaid Other | Admitting: Physical Therapy

## 2020-11-01 ENCOUNTER — Other Ambulatory Visit: Payer: Self-pay

## 2020-11-01 DIAGNOSIS — R29898 Other symptoms and signs involving the musculoskeletal system: Secondary | ICD-10-CM

## 2020-11-01 DIAGNOSIS — M6281 Muscle weakness (generalized): Secondary | ICD-10-CM

## 2020-11-01 DIAGNOSIS — R2689 Other abnormalities of gait and mobility: Secondary | ICD-10-CM

## 2020-11-01 DIAGNOSIS — M25562 Pain in left knee: Secondary | ICD-10-CM | POA: Diagnosis not present

## 2020-11-01 DIAGNOSIS — M25662 Stiffness of left knee, not elsewhere classified: Secondary | ICD-10-CM

## 2020-11-01 NOTE — Therapy (Signed)
[facility] [address] [place], [place], [address] Phone: [phone]   Fax:  [phone]  Physical Therapy Treatment  Patient Details  Name: Adam Wyatt MRN: [id number] Date of Birth: 1975-10-08 Referring Provider (PT): Dion Saucier PA   Encounter Date: 11/01/2020   PT End of Session - 11/01/20 1035    Visit Number 2    Number of Visits 12    Date for PT Re-Evaluation 12/09/20    Authorization Type Medicaid [employer]    Authorization Time Period 12 visits requested    Authorization - Visit Number 2    PT Start Time [phone]    PT Stop Time 1035    PT Time Calculation (min) 45 min    Activity Tolerance Patient tolerated treatment well    Behavior During Therapy [facility] for tasks assessed/performed           Past Medical History:  Diagnosis Date  . Scoliosis     Past Surgical History:  Procedure Laterality Date  . CARPAL TUNNEL RELEASE    . FRACTURE SURGERY    . KNEE ARTHROSCOPY WITH ANTERIOR CRUCIATE LIGAMENT (ACL) REPAIR WITH HAMSTRING GRAFT Left 10/17/2020   Procedure: LEFT KNEE ARTHROSCOPY WITH ANTERIOR CRUCIATE LIGAMENT (ACL), POSTERIOR CRUCIATE LIGAMENT, LATERAL COLLATERAL LIGAMENT REPAIR WITH HAMSTRING GRAFT, POSSIBLE MEIDAL MENISCECTOMY;  Surgeon: Yolonda Kida, MD;  Location: [facility] OR;  Service: Orthopedics;  Laterality: Left;  3 HRS  . MANDIBLE SURGERY    . ORTHOPEDIC SURGERY     pt has two screws in right knee, place in left arm   . TONSILLECTOMY      There were no vitals filed for this visit.   Subjective Assessment - 11/01/20 0950    Subjective Patient states that he has a little fluid on his knee.  He went to the MD on Wed and he wanted him to bend his knee.  He was able to bend to about 90 degrees.  He  has gotten the ok to shower    Limitations House hold activities;Walking;Standing;Lifting    Patient Stated Goals get his knee working again    Currently in Pain? Yes    Pain Score 5     Pain  Location Knee    Pain Orientation Left    Pain Descriptors / Indicators Aching    Pain Onset 1 to 4 weeks ago    Pain Frequency Intermittent                             [facility] Adult PT Treatment/Exercise - 11/01/20 0001      Ambulation/Gait   Ambulation Distance (Feet) 100 Feet    Assistive device Crutches    Gait Comments 50% of wb      Exercises   Exercises Knee/Hip      Knee/Hip Exercises: Stretches   Gastroc Stretch Left;30 seconds;Limitations    Gastroc Stretch Limitations Prom      Knee/Hip Exercises: Supine   Quad Sets Left;15 reps    Quad Sets Limitations and ham sets  15    Heel Slides 10 reps    Heel Slides Limitations 10 second  holds    Straight Leg Raises Left;15 reps      Knee/Hip Exercises: Sidelying   Hip ABduction Left;15 reps    Hip ADduction Left;15 reps      Knee/Hip Exercises: Prone         Manual Therapy  Manual Therapy Joint mobilization    Manual therapy comments completed seperate from all other aspectgs of treatment    Joint Mobilization patella mobs                    PT Short Term Goals - 11/01/20 1030      PT SHORT TERM GOAL #1   Title Patient will be independent with HEP in order to improve functional outcomes.    Time 2    Period Weeks    Status On-going    Target Date 11/11/20      PT SHORT TERM GOAL #2   Title Patient will be able to perform at least 10 SLR without extensor lag in order to demonstrate improving quad strength for ambulation.    Time 2    Period Weeks    Status On-going    Target Date 11/11/20             PT Long Term Goals - 11/01/20 1030      PT LONG TERM GOAL #1   Title Patient will improve ROM for left knee extension/flexion to 0-115 degrees or within protocol to improve mechanics with squatting, stairs, and transfers.    Time 4    Period Weeks    Status On-going    Target Date 12/09/20      PT LONG TERM GOAL #2   Title Patient will report at least 50% improvement  in symptoms for improved quality of life.    Time 4    Period Days    Status On-going      PT LONG TERM GOAL #3   Title Patient will be able to perform forward step down test if within protocol without deviation in order to demonstrate improved quad strength and motor control.    Time 6    Period Weeks    Status On-going                 Plan - 11/01/20 1036    Clinical Impression Statement Evaluation and goals reviewed with pt.  Used scale to determine 50% of wt for proper WB with cructches.  Added hamstring set, patella mob, Passive calf stretch and UBE to program.    Personal Factors and Comorbidities Fitness;Behavior Pattern;Time since onset of injury/illness/exacerbation;Profession    Examination-Activity Limitations Locomotion Level;Transfers;Bend;Stairs;Stand;Squat;Lift;Hygiene/Grooming    Examination-Participation Restrictions Meal Prep;Cleaning;Occupation;Community Activity;Shop;Volunteer;Yard Work;Driving    Stability/Clinical Decision Making Stable/Uncomplicated    Rehab Potential Good    PT Frequency 2x / week    PT Duration 6 weeks    PT Treatment/Interventions ADLs/Self Care Home Management;Aquatic Therapy;Cryotherapy;Electrical Stimulation;Iontophoresis 4mg /ml Dexamethasone;Moist Heat;Traction;Ultrasound;DME Instruction;Gait training;Stair training;Functional mobility training;Therapeutic activities;Therapeutic exercise;Balance training;Neuromuscular re-education;Patient/family education;Manual techniques;Passive range of motion;Dry needling;Energy conservation;Splinting;Taping;Spinal Manipulations;Joint Manipulations    PT Next Visit Plan f/u with ACL/PCL combo protocol, continue with the ACL protocol with slight adjustments of 50% weightbearing in extension as tolerated, and passive ROM only from 0-90; passive in proneuntil wk 5 brace 0-50; incrase brace 10 degrees per wk after this, WBAT, week 8 bregin stationary bike ;  protocol limited in guidance.    PT Home  Exercise Plan 4/4 quad set, SLR, hip abduction, heel slides, ankle pumps    Consulted and Agree with Plan of Care Patient           Patient will benefit from skilled therapeutic intervention in order to improve the following deficits and impairments:  Abnormal gait,Decreased range of motion,Difficulty walking,Decreased endurance,Increased muscle spasms,Decreased activity tolerance,Impaired perceived functional  ability,Pain,Decreased balance,Improper body mechanics,Decreased mobility,Decreased strength,Increased edema  Visit Diagnosis: Left knee pain, unspecified chronicity  Stiffness of left knee, not elsewhere classified  Muscle weakness (generalized)  Other abnormalities of gait and mobility  Other symptoms and signs involving the musculoskeletal system     Problem List There are no problems to display for this patient.   Virgina Organ, PT CLT [phone] 11/01/2020, 10:43 AM  [facility] [address] [place], [place], [address] Phone: [phone]   Fax:  [phone]  Name: Adam Wyatt MRN: [id number] Date of Birth: 1976-02-13

## 2020-11-04 ENCOUNTER — Encounter (HOSPITAL_COMMUNITY): Payer: Medicaid Other

## 2020-11-06 ENCOUNTER — Ambulatory Visit (HOSPITAL_COMMUNITY): Payer: Medicaid Other | Admitting: Physical Therapy

## 2020-11-06 ENCOUNTER — Other Ambulatory Visit: Payer: Self-pay

## 2020-11-06 ENCOUNTER — Encounter (HOSPITAL_COMMUNITY): Payer: Self-pay | Admitting: Physical Therapy

## 2020-11-06 DIAGNOSIS — M25562 Pain in left knee: Secondary | ICD-10-CM

## 2020-11-06 DIAGNOSIS — M25662 Stiffness of left knee, not elsewhere classified: Secondary | ICD-10-CM

## 2020-11-06 DIAGNOSIS — R2689 Other abnormalities of gait and mobility: Secondary | ICD-10-CM

## 2020-11-06 DIAGNOSIS — M6281 Muscle weakness (generalized): Secondary | ICD-10-CM

## 2020-11-06 NOTE — Therapy (Signed)
[facility] [address] [place], [place], [address] Phone: [phone]   Fax:  [phone]  Physical Therapy Treatment  Patient Details  Name: Adam Wyatt: [id number] Date of Birth: 1976-01-22 Referring Provider (PT): Dion Saucier PA   Encounter Date: 11/06/2020   PT End of Session - 11/06/20 0953    Visit Number 3    Number of Visits 12    Date for PT Re-Evaluation 12/09/20    Authorization Type Medicaid Healthy [employer]    Authorization Time Period 12 visits requested    Authorization - Visit Number 3    PT Start Time 1000   PT Stop Time 1040   PT Time Calculation (min) 40 min    Activity Tolerance Patient tolerated treatment well    Behavior During Therapy [facility] for tasks assessed/performed           Past Medical History:  Diagnosis Date  . Scoliosis     Past Surgical History:  Procedure Laterality Date  . CARPAL TUNNEL RELEASE    . FRACTURE SURGERY    . KNEE ARTHROSCOPY WITH ANTERIOR CRUCIATE LIGAMENT (ACL) REPAIR WITH HAMSTRING GRAFT Left 10/17/2020   Procedure: LEFT KNEE ARTHROSCOPY WITH ANTERIOR CRUCIATE LIGAMENT (ACL), POSTERIOR CRUCIATE LIGAMENT, LATERAL COLLATERAL LIGAMENT REPAIR WITH HAMSTRING GRAFT, POSSIBLE MEIDAL MENISCECTOMY;  Surgeon: Yolonda Kida, MD;  Location: [facility] OR;  Service: Orthopedics;  Laterality: Left;  3 HRS  . MANDIBLE SURGERY    . ORTHOPEDIC SURGERY     pt has two screws in right knee, place in left arm   . TONSILLECTOMY      There were no vitals filed for this visit.   Subjective Assessment - 11/06/20 0951    Subjective PT states that he has been completing his exercises.  No questions    Limitations House hold activities;Walking;Standing;Lifting    Patient Stated Goals get his knee working again    Currently in Pain? Yes    Pain Score 4     Pain Location Knee    Pain Orientation Left    Pain Descriptors / Indicators Aching    Pain Type Surgical pain    Pain Onset 1 to 4  weeks ago    Pain Frequency Intermittent    Aggravating Factors  being up on it    Pain Relieving Factors ice/ elevation                  [facility] Adult PT Treatment/Exercise - 11/06/20 0001      Exercises   Exercises Knee/Hip      Knee/Hip Exercises: Stretches   Gastroc Stretch Left;30 seconds;Limitations      Knee/Hip Exercises: Seated   Other Seated Knee/Hip Exercises UBE level 3 x 5'      Knee/Hip Exercises: Supine   Quad Sets Left;15 reps    Quad Sets Limitations Ham sets x 15    Heel Slides 15 reps    Heel Slides Limitations 10 second  holds    Straight Leg Raises Left;15 reps   2 #   Patellar Mobs completed    Other Supine Knee/Hip Exercises ab crunches with 4# in each hand      Knee/Hip Exercises: Sidelying   Hip ABduction Left;15 reps   2#   Hip ADduction Left;15 reps   2 #   Clams 15      Knee/Hip Exercises: Prone   Hip Extension 10 reps   B     Manual Therapy  Manual Therapy Edema management    Manual therapy comments completed seperate from all other aspectgs of treatment    Edema Management educated in self decongestive technique for edema control    Joint Mobilization patella mobs                    PT Short Term Goals - 11/01/20 1030      PT SHORT TERM GOAL #1   Title Patient will be independent with HEP in order to improve functional outcomes.    Time 2    Period Weeks    Status On-going    Target Date 11/11/20      PT SHORT TERM GOAL #2   Title Patient will be able to perform at least 10 SLR without extensor lag in order to demonstrate improving quad strength for ambulation.    Time 2    Period Weeks    Status On-going    Target Date 11/11/20             PT Long Term Goals - 11/01/20 1030      PT LONG TERM GOAL #1   Title Patient will improve ROM for left knee extension/flexion to 0-115 degrees or within protocol to improve mechanics with squatting, stairs, and transfers.    Time 4    Period Weeks    Status On-going     Target Date 12/09/20      PT LONG TERM GOAL #2   Title Patient will report at least 50% improvement in symptoms for improved quality of life.    Time 4    Period Days    Status On-going      PT LONG TERM GOAL #3   Title Patient will be able to perform forward step down test if within protocol without deviation in order to demonstrate improved quad strength and motor control.    Time 6    Period Weeks    Status On-going                 Plan - 11/06/20 0954    Clinical Impression Statement Increased reps with exercises with better quad control noted with SLR.  PT I ambulating with crutches with 50% wb without difficulty. Reps and wts will be added until protocol allows increased activity.    Personal Factors and Comorbidities Fitness;Behavior Pattern;Time since onset of injury/illness/exacerbation;Profession    Examination-Activity Limitations Locomotion Level;Transfers;Bend;Stairs;Stand;Squat;Lift;Hygiene/Grooming    Examination-Participation Restrictions Meal Prep;Cleaning;Occupation;Community Activity;Shop;Volunteer;Yard Work;Driving    Stability/Clinical Decision Making Stable/Uncomplicated    Rehab Potential Good    PT Frequency 2x / week    PT Duration 6 weeks    PT Treatment/Interventions ADLs/Self Care Home Management;Aquatic Therapy;Cryotherapy;Electrical Stimulation;Iontophoresis 4mg /ml Dexamethasone;Moist Heat;Traction;Ultrasound;DME Instruction;Gait training;Stair training;Functional mobility training;Therapeutic activities;Therapeutic exercise;Balance training;Neuromuscular re-education;Patient/family education;Manual techniques;Passive range of motion;Dry needling;Energy conservation;Splinting;Taping;Spinal Manipulations;Joint Manipulations    PT Next Visit Plan Add double calf raises, f/u with ACL/PCL combo protocol, continue with the ACL protocol with slight adjustments of 50% weightbearing in extension as tolerated, and passive ROM only from 0-90; passive in prone  until wk 5 brace 0-50; incrase brace 10 degrees per wk after this, WBAT, week 8 bregin stationary bike ;  protocol limited in guidance.    PT Home Exercise Plan 4/4 quad set, SLR, hip abduction, heel slides, ankle pumps; 4/13 :  hip extension keeping leg straight and clams    Consulted and Agree with Plan of Care Patient           Patient  will benefit from skilled therapeutic intervention in order to improve the following deficits and impairments:  Abnormal gait,Decreased range of motion,Difficulty walking,Decreased endurance,Increased muscle spasms,Decreased activity tolerance,Impaired perceived functional ability,Pain,Decreased balance,Improper body mechanics,Decreased mobility,Decreased strength,Increased edema  Visit Diagnosis: Left knee pain, unspecified chronicity  Stiffness of left knee, not elsewhere classified  Muscle weakness (generalized)  Other abnormalities of gait and mobility     Problem List There are no problems to display for this patient.   Virgina Organ, PT CLT [phone] 11/06/2020, 10:55 AM  [facility] [address] [place], [place], [address] Phone: [phone]   Fax:  [phone]  Name: Adam Wyatt Wyatt: [id number] Date of Birth: January 16, 1976

## 2020-11-08 ENCOUNTER — Encounter (HOSPITAL_COMMUNITY): Payer: Self-pay

## 2020-11-08 ENCOUNTER — Ambulatory Visit (HOSPITAL_COMMUNITY): Payer: Medicaid Other

## 2020-11-11 ENCOUNTER — Other Ambulatory Visit: Payer: Self-pay

## 2020-11-11 ENCOUNTER — Encounter (HOSPITAL_COMMUNITY): Payer: Self-pay | Admitting: Physical Therapy

## 2020-11-11 ENCOUNTER — Ambulatory Visit (HOSPITAL_COMMUNITY): Payer: Medicaid Other | Admitting: Physical Therapy

## 2020-11-11 DIAGNOSIS — R2689 Other abnormalities of gait and mobility: Secondary | ICD-10-CM

## 2020-11-11 DIAGNOSIS — M6281 Muscle weakness (generalized): Secondary | ICD-10-CM

## 2020-11-11 DIAGNOSIS — M25562 Pain in left knee: Secondary | ICD-10-CM | POA: Diagnosis not present

## 2020-11-11 DIAGNOSIS — M25662 Stiffness of left knee, not elsewhere classified: Secondary | ICD-10-CM

## 2020-11-11 NOTE — Therapy (Signed)
[facility] [address] [place], [place], [address] Phone: [phone]   Fax:  [phone]  Physical Therapy Treatment  Patient Details  Name: Adam Wyatt MRN: [id number] Date of Birth: 1976/01/01 Referring Provider (PT): Dion Saucier PA   Encounter Date: 11/11/2020   PT End of Session - 11/11/20 1003    Visit Number 4    Number of Visits 12    Date for PT Re-Evaluation 12/09/20    Authorization Type Medicaid Healthy Blue    Authorization Time Period 12 visits approved from 4/05 to 12/09/20 -if RECERT PERFORM ON 10th VISIT TO GET SIGNED POC    Authorization - Visit Number 4    Authorization - Number of Visits 12    Progress Note Due on Visit 10    PT Start Time 1002    PT Stop Time 1042    PT Time Calculation (min) 40 min    Activity Tolerance Patient tolerated treatment well    Behavior During Therapy [facility] for tasks assessed/performed           Past Medical History:  Diagnosis Date  . Scoliosis     Past Surgical History:  Procedure Laterality Date  . CARPAL TUNNEL RELEASE    . FRACTURE SURGERY    . KNEE ARTHROSCOPY WITH ANTERIOR CRUCIATE LIGAMENT (ACL) REPAIR WITH HAMSTRING GRAFT Left 10/17/2020   Procedure: LEFT KNEE ARTHROSCOPY WITH ANTERIOR CRUCIATE LIGAMENT (ACL), POSTERIOR CRUCIATE LIGAMENT, LATERAL COLLATERAL LIGAMENT REPAIR WITH HAMSTRING GRAFT, POSSIBLE MEIDAL MENISCECTOMY;  Surgeon: Yolonda Kida, MD;  Location: [facility] OR;  Service: Orthopedics;  Laterality: Left;  3 HRS  . MANDIBLE SURGERY    . ORTHOPEDIC SURGERY     pt has two screws in right knee, place in left arm   . TONSILLECTOMY      There were no vitals filed for this visit.   Subjective Assessment - 11/11/20 1009    Subjective Reports no pain, states he bought ankle weights and has been doing his exercises at home    Limitations House hold activities;Walking;Standing;Lifting    Patient Stated Goals get his knee working again    Currently in  Pain? No/denies    Pain Onset 1 to 4 weeks ago              [facility] PT Assessment - 11/11/20 0001      Assessment   Medical Diagnosis s/p L knee arthoscopic ACL, PCL, LCL reconstruction with hamstring allograft    Referring Provider (PT) Dion Saucier PA    Next MD Visit May 6th                         [facility] Adult PT Treatment/Exercise - 11/11/20 0001      Ambulation/Gait   Ambulation Distance (Feet) 150 Feet    Assistive device Crutches    Gait Comments 50% weight bearing      Knee/Hip Exercises: Stretches   Gastroc Stretch Left;5 reps;30 seconds   slant board     Knee/Hip Exercises: Standing   Heel Raises Both;5 sets;5 reps;5 seconds      Knee/Hip Exercises: Supine   Heel Slides Left;PROM;10 reps;3 sets    Heel Slides Limitations 10 second  holds    Straight Leg Raises Left;10 reps;3 sets   2# ankle weights 5" holds     Knee/Hip Exercises: Sidelying   Hip ABduction 2 sets;10 reps;Left   2# ankle weight     Knee/Hip Exercises:  Prone   Hip Extension Left;2 sets;10 reps   2# ankle weights                 PT Education - 11/11/20 1026    Education Details on importance of wearing brace at night while sleeping, on frequency of PT currently    Person(s) Educated Patient    Methods Explanation    Comprehension Verbalized understanding            PT Short Term Goals - 11/01/20 1030      PT SHORT TERM GOAL #1   Title Patient will be independent with HEP in order to improve functional outcomes.    Time 2    Period Weeks    Status On-going    Target Date 11/11/20      PT SHORT TERM GOAL #2   Title Patient will be able to perform at least 10 SLR without extensor lag in order to demonstrate improving quad strength for ambulation.    Time 2    Period Weeks    Status On-going    Target Date 11/11/20             PT Long Term Goals - 11/01/20 1030      PT LONG TERM GOAL #1   Title Patient will improve ROM for left knee extension/flexion  to 0-115 degrees or within protocol to improve mechanics with squatting, stairs, and transfers.    Time 4    Period Weeks    Status On-going    Target Date 12/09/20      PT LONG TERM GOAL #2   Title Patient will report at least 50% improvement in symptoms for improved quality of life.    Time 4    Period Days    Status On-going      PT LONG TERM GOAL #3   Title Patient will be able to perform forward step down test if within protocol without deviation in order to demonstrate improved quad strength and motor control.    Time 6    Period Weeks    Status On-going                 Plan - 11/11/20 1018    Clinical Impression Statement Reducing visits to 1x/week secondary to progress made and limitations with current protocol. Patient able to weight bear 50% on right leg, perform straight leg raises with 2 pound ankle weights. Next visit is 5 weeks post-op. Continued with LE strengthening with no difficulties or pain.  Educated patient on importance of continued donning of brace in bed at night and to continue to use crutches to maintain 50% weightbearing.    Personal Factors and Comorbidities Fitness;Behavior Pattern;Time since onset of injury/illness/exacerbation;Profession    Examination-Activity Limitations Locomotion Level;Transfers;Bend;Stairs;Stand;Squat;Lift;Hygiene/Grooming    Examination-Participation Restrictions Meal Prep;Cleaning;Occupation;Community Activity;Shop;Volunteer;Yard Work;Driving    Stability/Clinical Decision Making Stable/Uncomplicated    Rehab Potential Good    PT Frequency 2x / week    PT Duration 6 weeks    PT Treatment/Interventions ADLs/Self Care Home Management;Aquatic Therapy;Cryotherapy;Electrical Stimulation;Iontophoresis 4mg /ml Dexamethasone;Moist Heat;Traction;Ultrasound;DME Instruction;Gait training;Stair training;Functional mobility training;Therapeutic activities;Therapeutic exercise;Balance training;Neuromuscular re-education;Patient/family  education;Manual techniques;Passive range of motion;Dry needling;Energy conservation;Splinting;Taping;Spinal Manipulations;Joint Manipulations    PT Next Visit Plan 5 weeks post op is 11/21/20, f/u with ACL/PCL combo protocol, continue with the ACL protocol with slight adjustments of 50% weightbearing in extension as tolerated, and passive ROM only from 0-90; passive in prone until wk 5 brace 0-50; incrase brace 10 degrees per wk  after this, WBAT, week 8 bregin stationary bike ;  protocol limited in guidance.    PT Home Exercise Plan 4/4 quad set, SLR, hip abduction, heel slides, ankle pumps; 4/13 :  hip extension keeping leg straight and clams    Consulted and Agree with Plan of Care Patient           Patient will benefit from skilled therapeutic intervention in order to improve the following deficits and impairments:  Abnormal gait,Decreased range of motion,Difficulty walking,Decreased endurance,Increased muscle spasms,Decreased activity tolerance,Impaired perceived functional ability,Pain,Decreased balance,Improper body mechanics,Decreased mobility,Decreased strength,Increased edema  Visit Diagnosis: Left knee pain, unspecified chronicity  Stiffness of left knee, not elsewhere classified  Muscle weakness (generalized)  Other abnormalities of gait and mobility     Problem List There are no problems to display for this patient.  10:48 AM, 11/11/20 Tereasa Coop, DPT Physical Therapy with [facility]  [phone] office  [facility] [address] [place], [place], [address] Phone: [phone]   Fax:  [phone]  Name: Adam Wyatt MRN: [id number] Date of Birth: 06-11-1976

## 2020-11-13 ENCOUNTER — Encounter (HOSPITAL_COMMUNITY): Payer: Medicaid Other | Admitting: Physical Therapy

## 2020-11-15 ENCOUNTER — Encounter (HOSPITAL_COMMUNITY): Payer: Medicaid Other | Admitting: Physical Therapy

## 2020-11-18 ENCOUNTER — Ambulatory Visit (HOSPITAL_COMMUNITY): Payer: Medicaid Other | Admitting: Physical Therapy

## 2020-11-20 ENCOUNTER — Encounter (HOSPITAL_COMMUNITY): Payer: Medicaid Other | Admitting: Physical Therapy

## 2020-11-21 ENCOUNTER — Ambulatory Visit (HOSPITAL_COMMUNITY): Payer: Medicaid Other

## 2020-11-22 ENCOUNTER — Encounter (HOSPITAL_COMMUNITY): Payer: Medicaid Other | Admitting: Physical Therapy

## 2020-11-25 ENCOUNTER — Ambulatory Visit (HOSPITAL_COMMUNITY): Payer: Medicaid Other

## 2020-11-27 ENCOUNTER — Ambulatory Visit (HOSPITAL_COMMUNITY): Payer: Medicaid Other | Admitting: Physical Therapy

## 2020-11-29 ENCOUNTER — Encounter (HOSPITAL_COMMUNITY): Payer: Medicaid Other | Admitting: Physical Therapy

## 2020-12-02 ENCOUNTER — Ambulatory Visit (HOSPITAL_COMMUNITY): Payer: Medicaid Other

## 2020-12-02 ENCOUNTER — Telehealth (HOSPITAL_COMMUNITY): Payer: Self-pay

## 2020-12-02 NOTE — Telephone Encounter (Signed)
pt l/m to cx today - no reason given

## 2020-12-04 ENCOUNTER — Ambulatory Visit (HOSPITAL_COMMUNITY): Payer: Medicaid Other | Admitting: Physical Therapy

## 2020-12-06 ENCOUNTER — Encounter (HOSPITAL_COMMUNITY): Payer: Medicaid Other | Admitting: Physical Therapy

## 2020-12-09 ENCOUNTER — Ambulatory Visit (HOSPITAL_COMMUNITY): Payer: Medicaid Other

## 2020-12-11 ENCOUNTER — Ambulatory Visit (HOSPITAL_COMMUNITY): Payer: Medicaid Other | Admitting: Physical Therapy

## 2020-12-13 ENCOUNTER — Encounter (HOSPITAL_COMMUNITY): Payer: Medicaid Other

## 2020-12-16 ENCOUNTER — Ambulatory Visit (HOSPITAL_COMMUNITY): Payer: Medicaid Other

## 2020-12-18 ENCOUNTER — Ambulatory Visit (HOSPITAL_COMMUNITY): Payer: Medicaid Other | Admitting: Physical Therapy

## 2020-12-20 ENCOUNTER — Encounter (HOSPITAL_COMMUNITY): Payer: Medicaid Other | Admitting: Physical Therapy

## 2020-12-24 ENCOUNTER — Ambulatory Visit (HOSPITAL_COMMUNITY): Payer: Medicaid Other | Admitting: Physical Therapy

## 2021-01-08 ENCOUNTER — Encounter (HOSPITAL_COMMUNITY): Payer: Self-pay | Admitting: Physical Therapy

## 2021-01-08 NOTE — Therapy (Signed)
[place], [place], [address] Phone: [phone]   Fax:  [phone]  Patient Details  Name: Adam Wyatt MRN: [id number] Date of Birth: [birth year] Referring Provider:  No ref. provider found  Encounter Date: 01/08/2021 PHYSICAL THERAPY DISCHARGE SUMMARY  Visits from Start of Care: 4  Current functional level related to goals / functional outcomes: Unknown    Remaining deficits: Unknown    Education / Equipment: HPE   Patient agrees to discharge. Patient goals were partially met. Patient is being discharged due to not returning since the last visit.   Rayetta Humphrey, PT CLT [phone]  01/08/2021, 4:04 PM  [place] [address] [place], [place], [address] Phone: [phone]   Fax:  [phone]

## 2021-05-22 ENCOUNTER — Ambulatory Visit (INDEPENDENT_AMBULATORY_CARE_PROVIDER_SITE_OTHER): Payer: Medicaid Other

## 2021-05-22 ENCOUNTER — Other Ambulatory Visit: Payer: Self-pay

## 2021-05-22 ENCOUNTER — Ambulatory Visit
Admission: EM | Admit: 2021-05-22 | Discharge: 2021-05-22 | Disposition: A | Discharge status: Home / Self Care | Payer: Medicaid Other | Attending: Physician Assistant | Admitting: Physician Assistant

## 2021-05-22 ENCOUNTER — Encounter: Payer: Self-pay | Admitting: Emergency Medicine

## 2021-05-22 DIAGNOSIS — S2231XA Fracture of one rib, right side, initial encounter for closed fracture: Secondary | ICD-10-CM

## 2021-05-22 MED ORDER — OXYCODONE-ACETAMINOPHEN 5-325 MG PO TABS
1.0000 | ORAL_TABLET | ORAL | 0 refills | Status: AC | PRN
Start: 2021-05-22 — End: 2022-05-22

## 2021-05-22 NOTE — ED Triage Notes (Signed)
Patient c/o Fall that happened yesterday.   Patient is complains of RT sided rib pain.

## 2021-05-23 ENCOUNTER — Telehealth: Payer: Self-pay | Admitting: Urgent Care

## 2021-05-23 NOTE — ED Provider Notes (Signed)
Adam Wyatt    CSN: [id number] Arrival date & time: 05/22/21  1807      History   Chief Complaint Chief Complaint  Patient presents with   Fall    HPI Adam Wyatt is a 45 y.o. male.   Pt reports he fell and hit steps.  Pt complains of pain in his right ribs.  Pt fell yesterday and hit his right ribs.  Pt complains of pain with moving   The history is provided by the patient. No language interpreter was used.  Fall This is a new problem. The current episode started yesterday. The problem occurs constantly. The problem has not changed since onset.Associated symptoms include chest pain. Nothing aggravates the symptoms. Nothing relieves the symptoms. He has tried nothing for the symptoms. The treatment provided no relief.   Past Medical History:  Diagnosis Date   Scoliosis     There are no problems to display for this patient.   Past Surgical History:  Procedure Laterality Date   CARPAL TUNNEL RELEASE     FRACTURE SURGERY     KNEE ARTHROSCOPY WITH ANTERIOR CRUCIATE LIGAMENT (ACL) REPAIR WITH HAMSTRING GRAFT Left 10/17/2020   Procedure: LEFT KNEE ARTHROSCOPY WITH ANTERIOR CRUCIATE LIGAMENT (ACL), POSTERIOR CRUCIATE LIGAMENT, LATERAL COLLATERAL LIGAMENT REPAIR WITH HAMSTRING GRAFT, POSSIBLE MEIDAL MENISCECTOMY;  Surgeon: Yolonda Kida, MD;  Location: [facility] OR;  Service: Orthopedics;  Laterality: Left;  3 HRS   MANDIBLE SURGERY     ORTHOPEDIC SURGERY     pt has two screws in right knee, place in left arm    TONSILLECTOMY         Home Medications    Prior to Admission medications   Medication Sig Start Date End Date Taking? Authorizing Provider  oxyCODONE-acetaminophen (PERCOCET) 5-325 MG tablet Take 1 tablet by mouth every 4 (four) hours as needed for severe pain. 05/22/21 05/22/22 Yes Elson Areas, PA-C  ALPRAZolam Prudy Feeler) 0.5 MG tablet Take 0.5 mg by mouth at bedtime. Takes 0.5 daily if needed during the day    [provider]   methocarbamol (ROBAXIN) 500 MG tablet Take 1 tablet (500 mg total) by mouth every 6 (six) hours as needed for muscle spasms. 10/17/20   Yolonda Kida, MD  ondansetron (ZOFRAN ODT) 4 MG disintegrating tablet Take 1 tablet (4 mg total) by mouth every 8 (eight) hours as needed. 10/17/20   Yolonda Kida, MD    Family History History reviewed. No pertinent family history.  Social History Social History   Tobacco Use   Smoking status: Every Day    Packs/day: 0.50    Types: Cigarettes   Smokeless tobacco: Never  Vaping Use   Vaping Use: Never used  Substance Use Topics   Alcohol use: Yes    Alcohol/week: 4.0 standard drinks    Types: 4 Cans of beer per week    Comment: occasional   Drug use: No     Allergies   Codeine   Review of Systems Review of Systems  Respiratory:  Negative for cough.   Cardiovascular:  Positive for chest pain.  All other systems reviewed and are negative.   Physical Exam Triage Vital Signs ED Triage Vitals  Enc Vitals Group     BP 05/22/21 1948 128/85     Pulse Rate 05/22/21 1948 96     Resp 05/22/21 1948 18     Temp 05/22/21 1948 98 F (36.7 C)     Temp Source 05/22/21 1948 Oral  SpO2 05/22/21 1948 97 %     Weight --      Height --      Head Circumference --      Peak Flow --      Pain Score 05/22/21 1949 7     Pain Loc --      Pain Edu? --      Excl. in GC? --    No data found.  Updated Vital Signs BP 128/85 (BP Location: Right Arm)   Pulse 96   Temp 98 F (36.7 C) (Oral)   Resp 18   SpO2 97%   Visual Acuity Right Eye Distance:   Left Eye Distance:   Bilateral Distance:    Right Eye Near:   Left Eye Near:    Bilateral Near:     Physical Exam Vitals and nursing note reviewed.  Constitutional:      Appearance: He is well-developed.  HENT:     Head: Normocephalic.  Cardiovascular:     Rate and Rhythm: Normal rate.     Comments: Tender right lateral ribs Pulmonary:     Effort: Pulmonary effort is  normal.  Abdominal:     General: There is no distension.     Palpations: Abdomen is soft.     Tenderness: There is no abdominal tenderness.  Musculoskeletal:        General: Normal range of motion.     Cervical back: Normal range of motion.  Neurological:     Mental Status: He is alert and oriented to person, place, and time.  Psychiatric:        Mood and Affect: Mood normal.     UC Treatments / Results  Labs (all labs ordered are listed, but only abnormal results are displayed) Labs Reviewed - No data to display  EKG   Radiology DG Ribs Unilateral W/Chest Right  Result Date: 05/22/2021 CLINICAL DATA:  Fall and right chest wall pain. EXAM: RIGHT RIBS AND CHEST - 3+ VIEW COMPARISON:  Chest radiograph dated 06/15/2016. FINDINGS: Shallow inspiration. No focal consolidation, pleural effusion, or pneumothorax. The cardiac silhouette is within normal limits. Nondisplaced fracture of the lateral right ninth rib. IMPRESSION: Nondisplaced fracture of the lateral right ninth rib. No pneumothorax. Electronically Signed   By: Elgie Collard M.D.   On: 05/22/2021 19:55    Procedures Procedures (including critical Wyatt time)  Medications Ordered in UC Medications - No data to display  Initial Impression / Assessment and Plan / UC Course  I have reviewed the triage vital signs and the nursing notes.  Pertinent labs & imaging results that were available during my Wyatt of the patient were reviewed by me and considered in my medical decision making (see chart for details).     MDM:  Pt counseled on results.  Rx for percocet Final Clinical Impressions(s) / UC Diagnoses   Final diagnoses:  Closed fracture of one rib of right side, initial encounter   Discharge Instructions   None    ED Prescriptions     Medication Sig Dispense Auth. Provider   oxyCODONE-acetaminophen (PERCOCET) 5-325 MG tablet Take 1 tablet by mouth every 4 (four) hours as needed for severe pain. 20 tablet Elson Areas, [place]      I have reviewed the PDMP during this encounter. An After Visit Summary was printed and given to the patient.    Elson Areas, [place] 05/23/21 [phone]

## 2021-05-23 NOTE — Telephone Encounter (Signed)
Patient called requesting script for oxycodone be resent.  No [place] drug substance database shows that he has gotten oxycodone consistently, last dispense was 05/16/2021.  Patient is to use his chronic pain medication.  We will not do additional refills for this narcotic.  He can contact his chronic pain doctor for more.

## 2021-07-11 ENCOUNTER — Telehealth: Payer: Self-pay | Admitting: Family Medicine

## 2021-07-11 ENCOUNTER — Ambulatory Visit
Admission: EM | Admit: 2021-07-11 | Discharge: 2021-07-11 | Disposition: A | Discharge status: Home / Self Care | Payer: Medicaid Other

## 2021-07-11 ENCOUNTER — Other Ambulatory Visit: Payer: Self-pay

## 2021-07-11 ENCOUNTER — Encounter: Payer: Self-pay | Admitting: Emergency Medicine

## 2021-07-11 DIAGNOSIS — M7022 Olecranon bursitis, left elbow: Secondary | ICD-10-CM | POA: Diagnosis not present

## 2021-07-11 MED ORDER — PREDNISONE 20 MG PO TABS: 40.0000 mg | ORAL_TABLET | Freq: Every day | ORAL | 0 refills | Status: DC

## 2021-07-11 NOTE — ED Triage Notes (Signed)
Area on left elbow that is red and swollen x 2 days.  States he has had bursitis in that elbow

## 2021-07-11 NOTE — ED Provider Notes (Signed)
[facility]    CSN: [id number] Arrival date & time: 07/11/21  7588      History   Chief Complaint No chief complaint on file.   HPI Adam Wyatt is a 45 y.o. male.   Patient presenting today with left elbow swelling, pain x2 days.  Has history of bursitis in this elbow and states this is very similar.  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  He denies fever, chills, skin injury, numbness, tingling.  Has not tried anything over-the-counter for symptoms thus far.   Past Medical History:  Diagnosis Date   Scoliosis     There are no problems to display for this patient.   Past Surgical History:  Procedure Laterality Date   CARPAL TUNNEL RELEASE     FRACTURE SURGERY     KNEE ARTHROSCOPY WITH ANTERIOR CRUCIATE LIGAMENT (ACL) REPAIR WITH HAMSTRING GRAFT Left 10/17/2020   Procedure: LEFT KNEE ARTHROSCOPY WITH ANTERIOR CRUCIATE LIGAMENT (ACL), POSTERIOR CRUCIATE LIGAMENT, LATERAL COLLATERAL LIGAMENT REPAIR WITH HAMSTRING GRAFT, POSSIBLE MEIDAL MENISCECTOMY;  Surgeon: Yolonda Kida, MD;  Location: [facility] OR;  Service: Orthopedics;  Laterality: Left;  3 HRS   MANDIBLE SURGERY     ORTHOPEDIC SURGERY     pt has two screws in right knee, place in left arm    TONSILLECTOMY         Home Medications    Prior to Admission medications   Medication Sig Start Date End Date Taking? Authorizing Provider  ALPRAZolam Prudy Feeler) 0.5 MG tablet Take 0.5 mg by mouth at bedtime. Takes 0.5 daily if needed during the day    [provider]  methocarbamol (ROBAXIN) 500 MG tablet Take 1 tablet (500 mg total) by mouth every 6 (six) hours as needed for muscle spasms. 10/17/20   Yolonda Kida, MD  ondansetron (ZOFRAN ODT) 4 MG disintegrating tablet Take 1 tablet (4 mg total) by mouth every 8 (eight) hours as needed. 10/17/20    Yolonda Kida, MD  oxyCODONE-acetaminophen (PERCOCET) 5-325 MG tablet Take 1 tablet by mouth every 4 (four) hours as needed for severe pain. 05/22/21 05/22/22  Elson Areas, PA-C    Family History History reviewed. No pertinent family history.  Social History Social History   Tobacco Use   Smoking status: Every Day    Packs/day: 0.50    Types: Cigarettes   Smokeless tobacco: Never  Vaping Use   Vaping Use: Never used  Substance Use Topics   Alcohol use: Yes    Alcohol/week: 4.0 standard drinks    Types: 4 Cans of beer per week    Comment: occasional   Drug use: No     Allergies   Codeine   Review of Systems Review of Systems Per HPI  Physical Exam Triage Vital Signs ED Triage Vitals  Enc Vitals Group     BP 07/11/21 0830 122/76     Pulse Rate 07/11/21 0830 84     Resp 07/11/21 0830 18     Temp 07/11/21 0830 98.1 F (36.7 C)     Temp Source 07/11/21 0830 Oral     SpO2 07/11/21 0830 95 %     Weight --      Height --      Head Circumference --      Peak Flow --  Pain Score 07/11/21 0829 4     Pain Loc --      Pain Edu? --      Excl. in GC? --    No data found.  Updated Vital Signs BP 122/76 (BP Location: Right Arm)    Pulse 84    Temp 98.1 F (36.7 C) (Oral)    Resp 18    SpO2 95%   Visual Acuity Right Eye Distance:   Left Eye Distance:   Bilateral Distance:    Right Eye Near:   Left Eye Near:    Bilateral Near:     Physical Exam Vitals and nursing note reviewed.  Constitutional:      Appearance: Normal appearance.  HENT:     Head: Atraumatic.  Eyes:     Extraocular Movements: Extraocular movements intact.     Conjunctiva/sclera: Conjunctivae normal.  Cardiovascular:     Rate and Rhythm: Normal rate and regular rhythm.  Pulmonary:     Effort: Pulmonary effort is normal.     Breath sounds: Normal breath sounds.  Musculoskeletal:        General: Swelling and tenderness present. No deformity or signs of injury. Normal range  of motion.     Cervical back: Normal range of motion and neck supple.     Comments: Left olecranon bursa edematous, tender to palpation  Skin:    General: Skin is warm and dry.     Findings: Erythema present.     Comments: Minimal erythema over left posterior elbow No skin injury noted on exam  Neurological:     General: No focal deficit present.     Mental Status: He is oriented to person, place, and time.     Sensory: No sensory deficit.     Motor: No weakness.     Gait: Gait normal.  Psychiatric:        Mood and Affect: Mood normal.        Thought Content: Thought content normal.        Judgment: Judgment normal.     UC Treatments / Results  Labs (all labs ordered are listed, but only abnormal results are displayed) Labs Reviewed - No data to display  EKG   Radiology No results found.  Procedures Procedures (including critical care time)  Medications Ordered in UC Medications - No data to display  Initial Impression / Assessment and Plan / UC Course  I have reviewed the triage vital signs and the nursing notes.  Pertinent labs & imaging results that were available during my care of the patient were reviewed by me and considered in my medical decision making (see chart for details).     Will treat with prednisone for olecranon bursitis, no evidence of infection/cellulitis at this time so we will forego antibiotic therapy.  Discussed RICE protocol, over-the-counter pain relievers additionally.  Sports med follow-up if not fully resolving.  Final Clinical Impressions(s) / UC Diagnoses   Final diagnoses:  Olecranon bursitis of left elbow   Discharge Instructions   None    ED Prescriptions   None    PDMP not reviewed this encounter.   Roosvelt Maser [place], [place] 07/11/21 [phone]

## 2021-07-11 NOTE — Telephone Encounter (Signed)
Rx for prednisone sent.
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
## 2022-11-27 ENCOUNTER — Ambulatory Visit
Admission: EM | Admit: 2022-11-27 | Discharge: 2022-11-27 | Disposition: A | Discharge status: Home / Self Care | Payer: Medicaid Other

## 2022-11-27 ENCOUNTER — Encounter: Payer: Self-pay | Admitting: Emergency Medicine

## 2022-11-27 DIAGNOSIS — J441 Chronic obstructive pulmonary disease with (acute) exacerbation: Secondary | ICD-10-CM | POA: Diagnosis not present

## 2022-11-27 MED ORDER — IPRATROPIUM-ALBUTEROL 0.5-2.5 (3) MG/3ML IN SOLN
3.0000 mL | Freq: Once | RESPIRATORY_TRACT | Status: AC
  Administered 2022-11-27: 3 mL via RESPIRATORY_TRACT

## 2022-11-27 MED ORDER — METHYLPREDNISOLONE SODIUM SUCC 125 MG IJ SOLR
60.0000 mg | Freq: Once | INTRAMUSCULAR | Status: AC
  Administered 2022-11-27: 60 mg via INTRAMUSCULAR

## 2022-11-27 MED ORDER — ALBUTEROL SULFATE HFA 108 (90 BASE) MCG/ACT IN AERS: 1.0000 | INHALATION_SPRAY | Freq: Four times a day (QID) | RESPIRATORY_TRACT | 0 refills | Status: AC | PRN

## 2022-11-27 MED ORDER — PREDNISONE 20 MG PO TABS: 40.0000 mg | ORAL_TABLET | Freq: Every day | ORAL | 0 refills | Status: AC

## 2022-11-27 MED ORDER — CEFDINIR 300 MG PO CAPS: 300.0000 mg | ORAL_CAPSULE | Freq: Two times a day (BID) | ORAL | 0 refills | Status: DC

## 2022-11-27 NOTE — ED Provider Notes (Signed)
[facility]    CSN: [id number] Arrival date & time: 11/27/22  1118      History   Chief Complaint No chief complaint on file.   HPI Adam Wyatt is a 47 y.o. male.   Patient presents today with a 2 to 3-day history of URI symptoms including cough, sore throat, congestion, wheezing, shortness of breath.  Denies any fever, nausea, vomiting, chest pain.  He does report that his work contacts have been sick with "sinus infections".  He has tried some over-the-counter antihistamines including Claritin as well as Tylenol with minimal improvement of symptoms.  Denies any recent antibiotics or steroids.  He denies history of diabetes.  Denies history of allergies, asthma, COPD.  He is a current everyday smoker smoking approximately 1 pack/day and reports smoking for approximately 8 years.  He is having difficulty with daily activities as result of symptoms.    Past Medical History:  Diagnosis Date   Scoliosis     There are no problems to display for this patient.   Past Surgical History:  Procedure Laterality Date   CARPAL TUNNEL RELEASE     FRACTURE SURGERY     KNEE ARTHROSCOPY WITH ANTERIOR CRUCIATE LIGAMENT (ACL) REPAIR WITH HAMSTRING GRAFT Left 10/17/2020   Procedure: LEFT KNEE ARTHROSCOPY WITH ANTERIOR CRUCIATE LIGAMENT (ACL), POSTERIOR CRUCIATE LIGAMENT, LATERAL COLLATERAL LIGAMENT REPAIR WITH HAMSTRING GRAFT, POSSIBLE MEIDAL MENISCECTOMY;  Surgeon: Yolonda Kida, MD;  Location: [facility] OR;  Service: Orthopedics;  Laterality: Left;  3 HRS   MANDIBLE SURGERY     ORTHOPEDIC SURGERY     pt has two screws in right knee, place in left arm    TONSILLECTOMY         Home Medications    Prior to Admission medications   Medication Sig Start Date End Date Taking? Authorizing Provider  albuterol (VENTOLIN HFA) 108 (90 Base) MCG/ACT inhaler Inhale 1-2 puffs into the lungs every 6 (six) hours as needed for wheezing or shortness of breath. 11/27/22  Yes Allsion Nogales K,  PA-C  buprenorphine-naloxone (SUBOXONE) 2-0.5 mg SUBL SL tablet Place 1 tablet under the tongue daily.   Yes [provider]  cefdinir (OMNICEF) 300 MG capsule Take 1 capsule (300 mg total) by mouth 2 (two) times daily. 11/27/22  Yes Merrit Waugh K, PA-C  predniSONE (DELTASONE) 20 MG tablet Take 2 tablets (40 mg total) by mouth daily for 4 days. 11/28/22 12/02/22 Yes Quashon Jesus, Noberto Retort, PA-C  ALPRAZolam (XANAX) 0.5 MG tablet Take 0.5 mg by mouth at bedtime. Takes 0.5 daily if needed during the day    [provider]    Family History History reviewed. No pertinent family history.  Social History Social History   Tobacco Use   Smoking status: Every Day    Packs/day: .5    Types: Cigarettes   Smokeless tobacco: Never  Vaping Use   Vaping Use: Never used  Substance Use Topics   Alcohol use: Yes    Alcohol/week: 4.0 standard drinks of alcohol    Types: 4 Cans of beer per week    Comment: occasional   Drug use: No     Allergies   Codeine   Review of Systems Review of Systems  Constitutional:  Positive for activity change. Negative for appetite change, fatigue and fever.  HENT:  Positive for congestion, postnasal drip and sore throat. Negative for sinus pressure and sneezing.   Respiratory:  Positive for cough, chest tightness, shortness of breath and wheezing.  Cardiovascular:  Negative for chest pain.  Gastrointestinal:  Negative for abdominal pain, diarrhea, nausea and vomiting.     Physical Exam Triage Vital Signs ED Triage Vitals  Enc Vitals Group     BP 11/27/22 1129 111/76     Pulse Rate 11/27/22 1129 80     Resp 11/27/22 1129 18     Temp 11/27/22 1129 98.2 F (36.8 C)     Temp Source 11/27/22 1129 Oral     SpO2 11/27/22 1129 94 %     Weight --      Height --      Head Circumference --      Peak Flow --      Pain Score 11/27/22 1131 0     Pain Loc --      Pain Edu? --      Excl. in GC? --    No data found.  Updated Vital Signs BP 111/76 (BP  Location: Right Arm)   Pulse 80   Temp 98.2 F (36.8 C) (Oral)   Resp 18   SpO2 94%   Visual Acuity Right Eye Distance:   Left Eye Distance:   Bilateral Distance:    Right Eye Near:   Left Eye Near:    Bilateral Near:     Physical Exam Vitals reviewed.  Constitutional:      General: He is awake.     Appearance: Normal appearance. He is well-developed. He is not ill-appearing.     Comments: Very pleasant male appears stated age in no acute distress sitting comfortably in exam room  HENT:     Head: Normocephalic and atraumatic.     Right Ear: Tympanic membrane, ear canal and external ear normal. Tympanic membrane is not erythematous or bulging.     Left Ear: Ear canal and external ear normal. Tympanic membrane is retracted. Tympanic membrane is not erythematous or bulging.     Nose: Nose normal.     Mouth/Throat:     Pharynx: Uvula midline. Posterior oropharyngeal erythema present. No oropharyngeal exudate or uvula swelling.     Comments: Erythema and drainage in posterior oropharynx Cardiovascular:     Rate and Rhythm: Normal rate and regular rhythm.     Heart sounds: Normal heart sounds, S1 normal and S2 normal. No murmur heard. Pulmonary:     Effort: Pulmonary effort is normal. No accessory muscle usage or respiratory distress.     Breath sounds: No stridor. Wheezing present. No rhonchi or rales.     Comments: Widespread wheezing Neurological:     Mental Status: He is alert.  Psychiatric:        Behavior: Behavior is cooperative.      UC Treatments / Results  Labs (all labs ordered are listed, but only abnormal results are displayed) Labs Reviewed - No data to display  EKG   Radiology No results found.  Procedures Procedures (including critical care time)  Medications Ordered in UC Medications  ipratropium-albuterol (DUONEB) 0.5-2.5 (3) MG/3ML nebulizer solution 3 mL (3 mLs Nebulization Given 11/27/22 1203)  methylPREDNISolone sodium succinate  (SOLU-MEDROL) 125 mg/2 mL injection 60 mg (60 mg Intramuscular Given 11/27/22 1203)    Initial Impression / Assessment and Plan / UC Course  I have reviewed the triage vital signs and the nursing notes.  Pertinent labs & imaging results that were available during my care of the patient were reviewed by me and considered in my medical decision making (see chart for details).     Patient  is well-appearing, afebrile, nontoxic, nontachycardic.  Patient denies any formal diagnosis of COPD but does report that he is a smoker with a family history of COPD and has had an increase in cough with sputum production.  Given his risk factors will cover for COPD exacerbation.  He was started on Omnicef twice daily for 7 days.  He was given a DuoNeb and 60 mg of Solu-Medrol in clinic with improvement of symptoms.  He was sent home with an albuterol inhaler with instruction to use this every 4-6 hours as needed.  Will start prednisone burst of 40 mg tomorrow (11/28/2022).  Discussed that he is not to take NSAIDs with this medication but can use Tylenol, Mucinex, Flonase for symptom relief.  Recommend he follow-up with his primary care next week.  If he has any worsening or changing symptoms he is to return for reevaluation.  Strict return precautions given.  Final Clinical Impressions(s) / UC Diagnoses   Final diagnoses:  COPD exacerbation (HCC)     Discharge Instructions      I am glad that you are feeling better with the medication.  I am concerned that you may be developing COPD in the your current symptoms are related to an exacerbation of this condition.  It is very important that he quit smoking as we discussed.  We gave you an injection of steroids today.  Please start prednisone tomorrow (40 mg for 4 days).  Do not take NSAIDs with this medication including aspirin, ibuprofen/Advil, naproxen/Aleve.  Use albuterol every 4-6 hours as needed.  Start Omnicef twice daily for 7 days.  If your symptoms or not  improving within a week or if you have any worsening symptoms you need to be seen immediately.     ED Prescriptions     Medication Sig Dispense Auth. Provider   predniSONE (DELTASONE) 20 MG tablet Take 2 tablets (40 mg total) by mouth daily for 4 days. 8 tablet Shuayb Schepers K, PA-C   cefdinir (OMNICEF) 300 MG capsule Take 1 capsule (300 mg total) by mouth 2 (two) times daily. 14 capsule Ovie Eastep K, PA-C   albuterol (VENTOLIN HFA) 108 (90 Base) MCG/ACT inhaler Inhale 1-2 puffs into the lungs every 6 (six) hours as needed for wheezing or shortness of breath. 18 g Tahj Lindseth K, PA-C      PDMP not reviewed this encounter.   Jeani Hawking, PA-C 11/27/22 1224

## 2022-11-27 NOTE — Discharge Instructions (Signed)
I am glad that you are feeling better with the medication.  I am concerned that you may be developing COPD in the your current symptoms are related to an exacerbation of this condition.  It is very important that he quit smoking as we discussed.  We gave you an injection of steroids today.  Please start prednisone tomorrow (40 mg for 4 days).  Do not take NSAIDs with this medication including aspirin, ibuprofen/Advil, naproxen/Aleve.  Use albuterol every 4-6 hours as needed.  Start Omnicef twice daily for 7 days.  If your symptoms or not improving within a week or if you have any worsening symptoms you need to be seen immediately.

## 2022-11-27 NOTE — ED Triage Notes (Signed)
Productive cough and scratchy throat x 2 days.  States chest burns when coughing.  Has been taking tylenol

## 2023-04-02 DIAGNOSIS — Z79899 Other long term (current) drug therapy: Secondary | ICD-10-CM | POA: Diagnosis not present

## 2023-10-15 ENCOUNTER — Ambulatory Visit
Admission: EM | Admit: 2023-10-15 | Discharge: 2023-10-15 | Disposition: A | Discharge status: Home / Self Care | Attending: Family Medicine | Admitting: Family Medicine

## 2023-10-15 ENCOUNTER — Ambulatory Visit

## 2023-10-15 DIAGNOSIS — M25561 Pain in right knee: Secondary | ICD-10-CM | POA: Diagnosis not present

## 2023-10-15 MED ORDER — KETOROLAC TROMETHAMINE 60 MG/2ML IM SOLN
60.0000 mg | Freq: Once | INTRAMUSCULAR | Status: AC
  Administered 2023-10-15: 60 mg via INTRAMUSCULAR

## 2023-10-15 MED ORDER — DEXAMETHASONE SODIUM PHOSPHATE 10 MG/ML IJ SOLN
10.0000 mg | Freq: Once | INTRAMUSCULAR | Status: AC
  Administered 2023-10-15: 10 mg via INTRAMUSCULAR

## 2023-10-15 NOTE — ED Triage Notes (Signed)
 Pt reports while fixing a wheelchair ramp his foot got caught on a nail and he fell on his right knee and hurt his right bak of thigh as well x 9 days ago

## 2023-10-15 NOTE — Discharge Instructions (Signed)
Meds ordered this encounter  Medications   ketorolac (TORADOL) injection 60 mg   dexamethasone (DECADRON) injection 10 mg    

## 2023-10-16 NOTE — ED Provider Notes (Signed)
 [facility]   [id number] 10/15/23 Arrival Time: 1710  ASSESSMENT & PLAN:  1. Acute pain of right knee     I have personally viewed and independently interpreted the imaging studies ordered this visit. R knee: hardware intact; no acute bony changes appreciated.  Meds ordered this encounter  Medications   ketorolac (TORADOL) injection 60 mg   dexamethasone (DECADRON) injection 10 mg    Orders Placed This Encounter  Procedures   DG Knee Complete 4 Views Right   Recommend:  Follow-up Information     Schedule an appointment as soon as possible for a visit  with Aundria Rud Noah Delaine, MD.   Specialty: Orthopedic Surgery Contact information: [address] [place] [address] [place] [address] [phone]                Reviewed expectations re: course of current medical issues. Questions answered. Outlined signs and symptoms indicating need for more acute intervention. Patient verbalized understanding. After Visit Summary given.  SUBJECTIVE: History from: patient. Adam Wyatt is a 48 y.o. male who reports R knee pain s/p fall > 1 week ago; distant h/o patellar knee surgery with screw fixation. Is ambulatory. Bending knee hurts. No extremity sensation changes or weakness. Ibup helps.   Past Surgical History:  Procedure Laterality Date   CARPAL TUNNEL RELEASE     FRACTURE SURGERY     KNEE ARTHROSCOPY WITH ANTERIOR CRUCIATE LIGAMENT (ACL) REPAIR WITH HAMSTRING GRAFT Left 10/17/2020   Procedure: LEFT KNEE ARTHROSCOPY WITH ANTERIOR CRUCIATE LIGAMENT (ACL), POSTERIOR CRUCIATE LIGAMENT, LATERAL COLLATERAL LIGAMENT REPAIR WITH HAMSTRING GRAFT, POSSIBLE MEIDAL MENISCECTOMY;  Surgeon: Yolonda Kida, MD;  Location: [facility] OR;  Service: Orthopedics;  Laterality: Left;  3 HRS   MANDIBLE SURGERY     ORTHOPEDIC SURGERY     pt has two screws in right knee, place in left arm    TONSILLECTOMY        OBJECTIVE:  Vitals:   10/15/23 1723  BP: 120/75   Pulse: 77  Resp: 20  Temp: 97.8 F (36.6 C)  TempSrc: Oral  SpO2: 96%    General appearance: alert; no distress HEENT: [place]; AT Neck: supple with FROM Resp: unlabored respirations Extremities: RLE: warm with well perfused appearance; fairly well localized moderate tenderness over right anterior knee; without gross deformities; swelling: minimal; bruising: none; knee ROM: normal CV: brisk extremity capillary refill of RLE; 2+ DP pulse of RLE. Skin: warm and dry; no visible rashes Neurologic: gait normal; normal sensation and strength of RLE Psychological: alert and cooperative; normal mood and affect  Imaging: DG Knee Complete 4 Views Right Result Date: 10/15/2023 CLINICAL DATA:  Larey Seat on wheelchair ramp several days ago. Right knee injury and pain. EXAM: RIGHT KNEE - COMPLETE 4+ VIEW COMPARISON:  12/13/2017 FINDINGS: No evidence of acute fracture, dislocation, or joint effusion. Internal fixation screws are again seen within the patella. Prepatellar soft tissue swelling noted. IMPRESSION: Prepatellar soft tissue swelling. No evidence of acute fracture or dislocation. Electronically Signed   By: Danae Orleans M.D.   On: 10/15/2023 19:03      Allergies  Allergen Reactions   Codeine     HYDRO-CODONE; CAUSES ITCHING.    Past Medical History:  Diagnosis Date   Scoliosis    Social History   Socioeconomic History   Marital status: Single    Spouse name: Not on file   Number of children: Not on file   Years of education: Not on file   Highest education level: Not  on file  Occupational History   Not on file  Tobacco Use   Smoking status: Every Day    Current packs/day: 0.50    Types: Cigarettes   Smokeless tobacco: Never  Vaping Use   Vaping status: Never Used  Substance and Sexual Activity   Alcohol use: Yes    Alcohol/week: 4.0 standard drinks of alcohol    Types: 4 Cans of beer per week    Comment: occasional   Drug use: No   Sexual activity: Not on file  Other  Topics Concern   Not on file  Social History Narrative   Not on file   Social Drivers of Health   Financial Resource Strain: Not on file  Food Insecurity: Not on file  Transportation Needs: Not on file  Physical Activity: Not on file  Stress: Not on file  Social Connections: Not on file   History reviewed. No pertinent family history. Past Surgical History:  Procedure Laterality Date   CARPAL TUNNEL RELEASE     FRACTURE SURGERY     KNEE ARTHROSCOPY WITH ANTERIOR CRUCIATE LIGAMENT (ACL) REPAIR WITH HAMSTRING GRAFT Left 10/17/2020   Procedure: LEFT KNEE ARTHROSCOPY WITH ANTERIOR CRUCIATE LIGAMENT (ACL), POSTERIOR CRUCIATE LIGAMENT, LATERAL COLLATERAL LIGAMENT REPAIR WITH HAMSTRING GRAFT, POSSIBLE MEIDAL MENISCECTOMY;  Surgeon: Yolonda Kida, MD;  Location: [facility] OR;  Service: Orthopedics;  Laterality: Left;  3 HRS   MANDIBLE SURGERY     ORTHOPEDIC SURGERY     pt has two screws in right knee, place in left arm    TONSILLECTOMY         Mardella Layman, MD 10/16/23 [phone]
